# Patient Record
Sex: Female | Born: 1970 | Race: White | Hispanic: No | Smoking: Never smoker
Health system: Southern US, Community
[De-identification: ages and names within clinical notes are randomized; demographics above are authoritative.]

## PROBLEM LIST (undated history)

## (undated) DIAGNOSIS — R569 Unspecified convulsions: Secondary | ICD-10-CM

## (undated) DIAGNOSIS — I219 Acute myocardial infarction, unspecified: Secondary | ICD-10-CM

## (undated) DIAGNOSIS — E785 Hyperlipidemia, unspecified: Secondary | ICD-10-CM

## (undated) DIAGNOSIS — I10 Essential (primary) hypertension: Secondary | ICD-10-CM

## (undated) HISTORY — PX: NO PAST SURGERIES: SHX2092

---

## 2009-08-28 ENCOUNTER — Ambulatory Visit: Payer: Self-pay | Admitting: Internal Medicine

## 2010-10-23 ENCOUNTER — Ambulatory Visit: Payer: Self-pay | Admitting: Internal Medicine

## 2012-11-05 ENCOUNTER — Emergency Department: Payer: Self-pay | Admitting: Emergency Medicine

## 2012-11-05 LAB — CBC
HGB: 13.3 g/dL (ref 12.0–16.0)
MCH: 30.8 pg (ref 26.0–34.0)
MCHC: 35.2 g/dL (ref 32.0–36.0)
MCV: 87 fL (ref 80–100)
Platelet: 312 10*3/uL (ref 150–440)
RBC: 4.32 10*6/uL (ref 3.80–5.20)
WBC: 6.4 10*3/uL (ref 3.6–11.0)

## 2012-11-05 LAB — CK TOTAL AND CKMB (NOT AT ARMC): CK-MB: 1.2 ng/mL (ref 0.5–3.6)

## 2012-11-05 LAB — COMPREHENSIVE METABOLIC PANEL
Albumin: 3.8 g/dL (ref 3.4–5.0)
BUN: 8 mg/dL (ref 7–18)
Bilirubin,Total: 0.3 mg/dL (ref 0.2–1.0)
Co2: 28 mmol/L (ref 21–32)
EGFR (African American): >60
EGFR (Non-African Amer.): >60
Osmolality: 276 (ref 275–301)
Potassium: 3.7 mmol/L (ref 3.5–5.1)
SGPT (ALT): 36 U/L (ref 12–78)
Total Protein: 7.2 g/dL (ref 6.4–8.2)

## 2012-11-05 LAB — PRO B NATRIURETIC PEPTIDE: B-Type Natriuretic Peptide: 212 pg/mL — ABNORMAL HIGH (ref 0–125)

## 2012-11-05 LAB — TROPONIN I: Troponin-I: <0.02 ng/mL

## 2012-12-24 ENCOUNTER — Inpatient Hospital Stay: Payer: Self-pay | Admitting: Internal Medicine

## 2012-12-24 LAB — CBC
HGB: 12.6 g/dL (ref 12.0–16.0)
MCH: 31 pg (ref 26.0–34.0)
MCHC: 35.2 g/dL (ref 32.0–36.0)
Platelet: 301 10*3/uL (ref 150–440)
RDW: 12.9 % (ref 11.5–14.5)

## 2012-12-24 LAB — COMPREHENSIVE METABOLIC PANEL
Anion Gap: 3 — ABNORMAL LOW (ref 7–16)
Chloride: 110 mmol/L — ABNORMAL HIGH (ref 98–107)
Co2: 27 mmol/L (ref 21–32)
Creatinine: 0.68 mg/dL (ref 0.60–1.30)
EGFR (Non-African Amer.): >60
Glucose: 111 mg/dL — ABNORMAL HIGH (ref 65–99)
Osmolality: 278 (ref 275–301)
Potassium: 3.3 mmol/L — ABNORMAL LOW (ref 3.5–5.1)
Sodium: 140 mmol/L (ref 136–145)
Total Protein: 6.9 g/dL (ref 6.4–8.2)

## 2012-12-24 LAB — URINALYSIS, COMPLETE
Bilirubin,UR: NEGATIVE
Blood: NEGATIVE
Glucose,UR: NEGATIVE mg/dL (ref 0–75)
Ketone: NEGATIVE
Nitrite: NEGATIVE
Ph: 7 (ref 4.5–8.0)
Protein: NEGATIVE
Squamous Epithelial: NONE SEEN

## 2012-12-24 LAB — TROPONIN I: Troponin-I: <0.02 ng/mL

## 2012-12-24 LAB — CK TOTAL AND CKMB (NOT AT ARMC)
CK, Total: 129 U/L (ref 21–215)
CK-MB: 1.7 ng/mL (ref 0.5–3.6)

## 2012-12-25 LAB — CK TOTAL AND CKMB (NOT AT ARMC)
CK, Total: 113 U/L (ref 21–215)
CK, Total: 99 U/L (ref 21–215)
CK-MB: 2.1 ng/mL (ref 0.5–3.6)
CK-MB: 1.9 ng/mL (ref 0.5–3.6)

## 2012-12-25 LAB — LIPID PANEL
Cholesterol: 139 mg/dL (ref 0–200)
Ldl Cholesterol, Calc: 74 mg/dL (ref 0–100)
VLDL Cholesterol, Calc: 25 mg/dL (ref 5–40)

## 2012-12-25 LAB — CK-MB
CK-MB: 2.5 ng/mL (ref 0.5–3.6)
CK-MB: 1.7 ng/mL (ref 0.5–3.6)

## 2012-12-25 LAB — TROPONIN I: Troponin-I: 0.31 ng/mL — ABNORMAL HIGH

## 2012-12-25 LAB — MAGNESIUM: Magnesium: 1.8 mg/dL

## 2012-12-25 LAB — POTASSIUM: Potassium: 4.1 mmol/L (ref 3.5–5.1)

## 2012-12-26 LAB — BASIC METABOLIC PANEL
Calcium, Total: 8.5 mg/dL (ref 8.5–10.1)
Creatinine: 0.67 mg/dL (ref 0.60–1.30)
EGFR (African American): >60
EGFR (Non-African Amer.): >60
Glucose: 101 mg/dL — ABNORMAL HIGH (ref 65–99)

## 2012-12-26 LAB — CBC
HCT: 37.7 % (ref 35.0–47.0)
MCH: 30.8 pg (ref 26.0–34.0)
MCHC: 34.6 g/dL (ref 32.0–36.0)
MCV: 89 fL (ref 80–100)
Platelet: 309 10*3/uL (ref 150–440)
RBC: 4.24 10*6/uL (ref 3.80–5.20)
RDW: 12.6 % (ref 11.5–14.5)

## 2013-08-08 ENCOUNTER — Ambulatory Visit: Payer: Self-pay | Admitting: Internal Medicine

## 2014-02-16 DIAGNOSIS — I219 Acute myocardial infarction, unspecified: Secondary | ICD-10-CM

## 2014-02-16 HISTORY — PX: CARDIAC CATHETERIZATION: SHX172

## 2014-02-16 HISTORY — DX: Acute myocardial infarction, unspecified: I21.9

## 2014-06-08 NOTE — Discharge Summary (Signed)
PATIENT NAME:  Samantha Watson, Samantha Watson MR#:  [id number] DATE OF BIRTH:  03-22-1970  DATE OF ADMISSION:  12/24/2012 DATE OF DISCHARGE:  12/26/2012  PRIMARY CARE PHYSICIAN: None local.  CONSULTING PHYSICIAN: Darlin Priestly. Fath, MD  DISCHARGE DIAGNOSIS: 1.  Non-ST segment elevation myocardial infarction.  2.  Hypertension.  3.  Seizure disorder.   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   CONDITION: Stable.   CODE STATUS: Full code.   HOME MEDICATIONS: Atenolol 50 mg p.o. daily, carbamazepine 200 mg p.o. b.i.d., Lipitor 40 mg p.o. at bedtime.   NEW MEDICATIONS:  1.  Imdur 30 mg p.o. daily, nitroglycerin 0.4 mg sublingual every 5 minutes p.r.n. for chest pain.  2.  Aspirin 325 mg p.o. daily.   DIET: Low-sodium, low-fat, low-cholesterol diet.   ACTIVITY: As tolerated.   FOLLOWUP CARE: Follow up with PCP within 1 to 2 weeks. Follow up with Dr. Lady Gary within 1 to 2 weeks.   REASON FOR ADMISSION: Chest pain.   HOSPITAL COURSE: The patient is a 44 year old Caucasian female with a history of hypertension, seizure disorder. The patient also had a recent abnormal stress test due to chest pain. The patient presented to ED with chest pain, which is retrosternal, 7 out of 10 intensity, nonradiating. For detailed history and physical examination, please refer to the admission note dictated by Dr. Clint Guy. On admission date, the patient's laboratory data showed EKG normal sinus rhythm at 74 BPM. BUN 6, sodium 140, potassium 3.3, chloride 110, glucose 111. CBC: 11.5. Urinalysis negative. Troponin was 0.02 on admission date; however, the patient's troponin increased to 0.30 and 0.31. The patient was diagnosed with non-STEMI. After admission, the patient has been treated with aspirin, statin and Lovenox. Dr. Lady Gary discussed with the patient, did a cardiac catheterization today which showed RCA 90% stenosis. Dr. Lady Gary recommended medical treatment and suggested adding Imdur today. The patient  had no more chest pain after admission. The patient's vital signs are stable. She is clinically stable and will be discharged to home today 6 hours after cardiac catheterization if no bleeding.   For hypokalemia, the patient was treated with potassium. Potassium level increased to normal range.   For hypertension, the patient has been treated with atenolol.   The patient is clinically stable and will be discharged to home today. I discussed the patient's discharge plan with the patient, nurse, case manager and patient's father.   TIME SPENT: About 35 minutes.   ____________________________ Shaune Pollack, MD qc:jcm D: 12/26/2012 15:32:50 ET T: 12/26/2012 19:48:52 ET JOB#: [id number]  cc: Shaune Pollack, MD, <Dictator> Shaune Pollack MD ELECTRONICALLY SIGNED 12/27/2012 13:34

## 2014-06-08 NOTE — H&P (Signed)
PATIENT NAME:  Samantha Watson, Samantha Watson MR#:  [id number] DATE OF BIRTH:  12-23-1970  DATE OF ADMISSION:  12/24/2012  REFERRING PHYSICIAN:  Dr. Lenard Lance.   PRIMARY CARE PHYSICIAN:  Nonlocal, out of [facility].   CHIEF COMPLAINT:  Chest pain.   HISTORY OF PRESENT ILLNESS:  This is a 44 year old Caucasian female with past medical history of hypertension, seizure disorder, who also has a recent abnormal stress test who is presenting with chest pain.  She has one day duration of acute chest which occurred at rest while she was watching TV, states it was retrosternal, 7 out of 10 in intensity, nonradiating, described as a pressure with tightness with associated shortness of breath.  She states that she has had similar symptoms over the preceding few weeks though mainly they are with exertion when she is exercising, but had similar symptoms when she underwent her stress test, states her symptoms are also brought on by emotional stress.  She denies any relieving factors.  On presentation to the Emergency Department, she was given aspirin and nitroglycerin.  She is currently chest pain-free, given the history of recent stress test that was abnormal per the patient.  Case was discussed with Dr. Lady Gary who is unable to find the results of stress of test at this time.    REVIEW OF SYSTEMS: CONSTITUTIONAL:  Denies fevers, fatigue, weakness.  EYES:  Denies blurred vision, double vision, eye pain.  EARS, NOSE, THROAT:  Denies ear pain, tinnitus or discharge.  RESPIRATORY:  Denies cough, wheeze, shortness of breath.  CARDIOVASCULAR:  Positive for chest pain as above.  However, denies any orthopnea, edema, arrhythmias, palpitations, lower extremity edema.  GASTROINTESTINAL:  Denies nausea, vomiting, diarrhea, abdominal pain.  GENITOURINARY:  Denies dysuria, hematuria.  ENDOCRINE:  Denies nocturia or polyuria.  HEMATOLOGIC AND LYMPHATIC:  Denies easy bruising or bleeding.  SKIN:  Denies rashes or lesions.   MUSCULOSKELETAL:  Denies any pain in neck, back, knees, shoulders, hips, any arthritic symptoms.  NEUROLOGIC:  Denies any paralysis, paresthesias. PSYCHIATRIC:  Denies any anxiety or depressive symptoms.  Otherwise, full review of systems performed by me is negative.   PAST MEDICAL HISTORY:  Hypertension as well as seizure disorder, last seizure many years ago.   FAMILY HISTORY:  Positive for hypertension as well as coronary artery disease.  Apparently her grandfather had a myocardial infarction she believes in his early 78s though she is unsure.   SOCIAL HISTORY:  Denies any alcohol, tobacco or drug usage.   ALLERGIES:  PENICILLIN.   HOME MEDICATIONS:  Include atenolol 50 mg by mouth daily, carbamazepine 200 mg by mouth twice daily, Lipitor 40 mg by mouth at bedtime.   PHYSICAL EXAMINATION: VITAL SIGNS:  Temperature 98.1, heart rate 73, respirations 18, blood pressure 100/53, saturating 98% on room air.  Weight 57.6 kg.  GENERAL:  Well-nourished, well-developed, Caucasian female who is currently in no acute distress.  HEAD:  Normocephalic, atraumatic.  EYES:  Pupils equal, round, reactive to light as well as accommodation.  Extraocular muscles intact.  No scleral icterus.  MOUTH:  Moist mucous membranes.  Dentition intact.  No abscess noted.  EAR, NOSE, THROAT:  Throat clear without exudates.  No external lesions.  NECK:  Supple.  No thyromegaly.  No JVD.  No nodules.  PULMONARY:  Clear to auscultation bilaterally without wheezes, rubs or rhonchi.  No use of accessory muscles with respiratory effort.  CHEST:  Nontender to palpation.  CARDIOVASCULAR:  S1, S2, regular rate and rhythm.  No  murmurs, rubs or gallops.  No peripheral edema.  Pedal pulses 2+ bilaterally.  GASTROINTESTINAL:  Soft, nontender, nondistended.  No masses.  Positive bowel sounds.  No hepatosplenomegaly.  MUSCULOSKELETAL:  No swelling, clubbing or edema.  Range of motion full in all extremities.  NEUROLOGIC:  Cranial  nerves II through XII intact.  No gross focal neurological deficits.  Sensation intact.  Reflexes intact.  SKIN:  No ulcerations, lesions, rashes or cyanosis.  Skin is warm and dry.  Turgor is intact. PSYCHIATRIC:  Mood and affect within normal limits.  The patient is awake, alert, oriented x 3.  Insight and judgment are intact.   LABORATORY DATA:  EKG:  Normal sinus rhythm, heart rate 74.  No significant ST or T wave abnormalities, though minimal voltage criteria for left ventricular hypertrophy.  Sodium 140, potassium of 3.3, chloride 110, bicarb 27, BUN 6, glucose 111.  LFTs within normal limits.  Cardiac enzymes, troponin I less than 0.02, CK-MB 1.7, CK 129, WBC 11.5, hemoglobin 12.6, platelets 301.  Urinalysis negative for evidence of infection.   ASSESSMENT AND PLAN:  A 44 year old Caucasian female with past medical history of hypertension, seizure disorder as well as her self-reported recent abnormal stress test presenting with chest pain.  1.  Chest pain.  She will be given aspirin and nitroglycerin.  She is now chest pain-free.  We will trend cardiac enzymes.  Admit to telemetry under observation.  We will consult cardiology and the case discussed with Dr. Lady Gary.  Unfortunately he was unable to find the results of her recent stress test.  We will check her lipid panel.  Continue her statin and beta-blockade.  2.  Hypokalemia.  Replace potassium with a goal between 4 and 5.  3.  Hypertension.  Continue atenolol.  4.  Seizure disorder:  Continue carbamazepine.  5.  CODE STATUS:  THE PATIENT IS A FULL CODE.   TIME SPENT:  45 minutes.    ____________________________ Cletis Athens. Hower, MD dkh:ea D: 12/24/2012 22:28:13 ET T: 12/24/2012 23:02:06 ET JOB#: [id number]  cc: Cletis Athens. Hower, MD, <Dictator> DAVID Synetta Shadow MD ELECTRONICALLY SIGNED 12/25/2012 0:25

## 2014-06-08 NOTE — Consult Note (Signed)
Present Illness 44 yo female with history of seizure disorder and hypertension who has had an outpatient work up for chest pain with both typical and atypical features. She underwent a exercize stress echo as an outpatient on 12/02/12 which was read as showing apical ischemia. Cardiac cath was discussed and recommended but deferred per patient. She is now admitted with recurrent mid sternal chest pain. EKG is unremarkable. Mi8ld troponin elevation to 0.3. Pain has resolved.   Physical Exam:  GEN well developed, well nourished, no acute distress   HEENT PERRL, hearing intact to voice   NECK supple   RESP normal resp effort  clear BS   CARD Regular rate and rhythm  No murmur   ABD denies tenderness  no liver/spleen enlargement  no Adominal Mass   LYMPH negative neck   EXTR negative cyanosis/clubbing, negative edema   SKIN normal to palpation   NEURO cranial nerves intact, motor/sensory function intact   PSYCH A+O to time, place, person   Review of Systems:  Subjective/Chief Complaint chest pain with mild troponin elevation and abnormal functional study as outpatient.   Skin: No Complaints   ENT: No Complaints   Eyes: No Complaints   Neck: No Complaints   Respiratory: No Complaints   Cardiovascular: Chest pain or discomfort   Gastrointestinal: No Complaints   Genitourinary: No Complaints   Vascular: No Complaints   Musculoskeletal: No Complaints   Neurologic: No Complaints   Hematologic: No Complaints   Endocrine: No Complaints   Psychiatric: No Complaints   Review of Systems: All other systems were reviewed and found to be negative   Medications/Allergies Reviewed Medications/Allergies reviewed   EKG:  EKG NSR   Abnormal NSSTTW changes   Radiology Results: XRay:    08-Nov-14 18:05, Chest Portable Single View  Chest Portable Single View   REASON FOR EXAM:    Chest pain  COMMENTS:       PROCEDURE: DXR - DXR PORTABLE CHEST SINGLE VIEW  - Dec 24 2012  6:05PM     CLINICAL DATA:  Chest pain    EXAM:  PORTABLE CHEST - 1 VIEW    COMPARISON:  None.    FINDINGS:  Lungs are clear. No pleural effusion or pneumothorax.  The heart is normal in size.     IMPRESSION:  No evidence of acute cardiopulmonary disease.      Electronically Signed    By: Charline Bills M.D.    On: 12/24/2012 18:08         Verified By: Charline Bills, M.D.,    Penicillin: Hives   Impression 44 yo female with history of hypertension and seizure disorder who was admitted with complaints of midsternal chest pain. SHe had an outpatient workuip for this and stress echo suggested apical ischemia. She deferred cardiac cath at that time. She now is admitted with recurrent chest pain and has ruled in for a small nstemi with serum troponin of 0.3. SHe is currently hemodynamically stable with no chest pain. EKG unremarkable. Discussed risk and benefits of cardiac cath with patient and she agrees to proceed.   Plan 1. Continue with beta blockers, statin, asa, lovenox 2. Risk and benefits of cardiac cath explained to patient and she agrees to proceed. 3. Proceed with left cardiac cath via left radial or right groin in am. 4. Further recs after cath.   Electronic Signatures: Dalia Heading (MD)  (Signed [phone] 08:37)  Authored: General Aspect/Present Illness, History and Physical Exam, Review  of System, Home Medications, EKG , Radiology, Allergies, Impression/Plan   Last Updated: 09-Nov-14 08:37 by Dalia Heading (MD)

## 2014-09-05 ENCOUNTER — Ambulatory Visit
Admission: EM | Admit: 2014-09-05 | Discharge: 2014-09-05 | Disposition: A | Discharge status: Home / Self Care | Payer: Medicare Other | Attending: Internal Medicine | Admitting: Internal Medicine

## 2014-09-05 ENCOUNTER — Encounter: Payer: Self-pay | Admitting: Emergency Medicine

## 2014-09-05 DIAGNOSIS — L0292 Furuncle, unspecified: Secondary | ICD-10-CM | POA: Diagnosis not present

## 2014-09-05 MED ORDER — SULFAMETHOXAZOLE-TRIMETHOPRIM 800-160 MG PO TABS: 1.0000 | ORAL_TABLET | Freq: Two times a day (BID) | ORAL | Status: AC

## 2014-09-05 NOTE — ED Provider Notes (Addendum)
CSN: [id number]     Arrival date & time 09/05/14  1627 History   First MD Initiated Contact with Patient 09/05/14 1720     Chief Complaint  Patient presents with  . Cyst    right axilla   HPI  History reviewed. Pertinent past medical history: Coronary disease, hyperlipidemia, seizure disorder, hypertension, anemia, reflux. History reviewed. No pertinent past surgical history. History reviewed. No pertinent family history. History  Substance Use Topics  . Smoking status: Never Smoker   . Smokeless tobacco: Not on file  . Alcohol Use: No    Review of Systems  All other systems reviewed and are negative.   Allergies  Penicillins  Home Medications   carBAMazepine (TEGRETOL) 200 mg tablet Take 1 tablet PO BID   11/24/2012  Active  omeprazole (PRILOSEC) 40 MG DR capsule Take 1 tablet PO every morning on an empty stomach and eat 20 to 30 minutes later   12/08/2012  Active  garlic 1 mg Cap Take by mouth.     Active  multivitamin-Ca-iron-minerals (MULTIPLE VITAMIN, WOMENS) Tab Take by mouth.     Active  atenolol (TENORMIN) 50 MG tablet Take 50 mg by mouth once daily.  11 09/26/2013  Active  simvastatin (ZOCOR) 40 MG tablet Take 40 mg by mouth every evening.  11 09/26/2013  Active  NITROSTAT 0.4 mg SL tablet DISSOLVE ONE TABLET ON THE TONGUE EVERY 5 MINUTES AS NEEDED FOR CHEST PAIN 25 tablet  0 12/11/2013  Active  triamcinolone 0.5 % ointment Apply topically 2 (two) times daily. 15 g  0 03/20/2014 03/20/2015 Active  isosorbide mononitrate (IMDUR) 60 MG ER tablet Take 1 tablet (60 mg total) by mouth once daily. 90 tablet  4 04/26/2014  Active  isosorbide mononitrate (IMDUR) 60 MG ER tablet TAKE 1 TABLET BY MOUTH ONCE A DAY 30 tablet          BP 123/86 mmHg  Pulse 65  Temp(Src) 98.1 F (36.7 C) (Oral)  Resp 16  SpO2 100% Physical Exam  Constitutional: She is oriented to person, place, and time. No distress.  Alert, nicely groomed  HENT:  Head:  Atraumatic.  Eyes:  Conjugate gaze, no eye redness/drainage  Neck: Neck supple.  Cardiovascular: Normal rate.   Pulmonary/Chest: No respiratory distress.  Abdominal: She exhibits no distension.  Musculoskeletal: Normal range of motion.  No leg swelling  Neurological: She is alert and oriented to person, place, and time.  Skin: Skin is warm and dry.  No cyanosis Under the right axilla there are several small red papules and pustules, some with underlying cysts. Patient's attention is on an 8 mm cyst that is slightly tender, not fluctuant, not pointing or draining. Freely mobile. Slightly compressible, not hard.  Nursing note and vitals reviewed.   ED Course  Procedures  No procedures at the urgent care today  MDM   1. Deep folliculitis    prescription for Bactrim sent to the pharmacy. Anticipate slow improvement over the next 10-14 days. Recheck or follow up PCP if not improving over this time course.  Eustace Moore, MD 09/05/14 1744  Eustace Moore, MD 09/05/14 1746  Eustace Moore, MD 09/05/14 1747  Eustace Moore, MD 09/05/14 [phone]

## 2014-09-05 NOTE — ED Notes (Signed)
Pt states that she noticed last night she had a knot under her Right axilla

## 2014-09-05 NOTE — Discharge Instructions (Signed)
Prescription for bactrim/sulfamethoxazole (antibiotic, antiinflammatory) sent to the pharmacy. Anticipate gradual improvement over the next 10-14 days.  Recheck or see you primary care provider if not improving as expected.  Folliculitis  Folliculitis is redness, soreness, and swelling (inflammation) of the hair follicles. This condition can occur anywhere on the body. People with weakened immune systems, diabetes, or obesity have a greater risk of getting folliculitis. CAUSES  Bacterial infection. This is the most common cause.  Fungal infection.  Viral infection.  Contact with certain chemicals, especially oils and tars. Long-term folliculitis can result from bacteria that live in the nostrils. The bacteria may trigger multiple outbreaks of folliculitis over time. SYMPTOMS Folliculitis most commonly occurs on the scalp, thighs, legs, back, buttocks, and areas where hair is shaved frequently. An early sign of folliculitis is a small, white or yellow, pus-filled, itchy lesion (pustule). These lesions appear on a red, inflamed follicle. They are usually less than 0.2 inches (5 mm) wide. When there is an infection of the follicle that goes deeper, it becomes a boil or furuncle. A group of closely packed boils creates a larger lesion (carbuncle). Carbuncles tend to occur in hairy, sweaty areas of the body. DIAGNOSIS  Your caregiver can usually tell what is wrong by doing a physical exam. A sample may be taken from one of the lesions and tested in a lab. This can help determine what is causing your folliculitis. TREATMENT  Treatment may include:  Applying warm compresses to the affected areas.  Taking antibiotic medicines orally or applying them to the skin.  Draining the lesions if they contain a large amount of pus or fluid.  Laser hair removal for cases of long-lasting folliculitis. This helps to prevent regrowth of the hair. HOME CARE INSTRUCTIONS  Apply warm compresses to the affected  areas as directed by your caregiver.  If antibiotics are prescribed, take them as directed. Finish them even if you start to feel better.  You may take over-the-counter medicines to relieve itching.  Do not shave irritated skin.  Follow up with your caregiver as directed. SEEK IMMEDIATE MEDICAL CARE IF:   You have increasing redness, swelling, or pain in the affected area.  You have a fever. MAKE SURE YOU:  Understand these instructions.  Will watch your condition.  Will get help right away if you are not doing well or get worse. Document Released: 04/13/2001 Document Revised: 08/04/2011 Document Reviewed: 05/05/2011 [facility] Patient Information 2015 [place], [place]. This information is not intended to replace advice given to you by your health care provider. Make sure you discuss any questions you have with your health care provider.

## 2014-10-11 IMAGING — CR DG CHEST 2V
1 series · 2 of 2 positions shown · non-contrast
Comparison: none

REASON FOR EXAM: Chest Pain
COMMENTS:

PROCEDURE:     DXR - DXR CHEST PA (OR AP) AND LATERAL  - November 05, 2012  [DATE]
RESULT:     Comparison: None

[Series 1: w chest pa · 0.14mm/px · 2 of 2 slices shown]
[im 1/2]
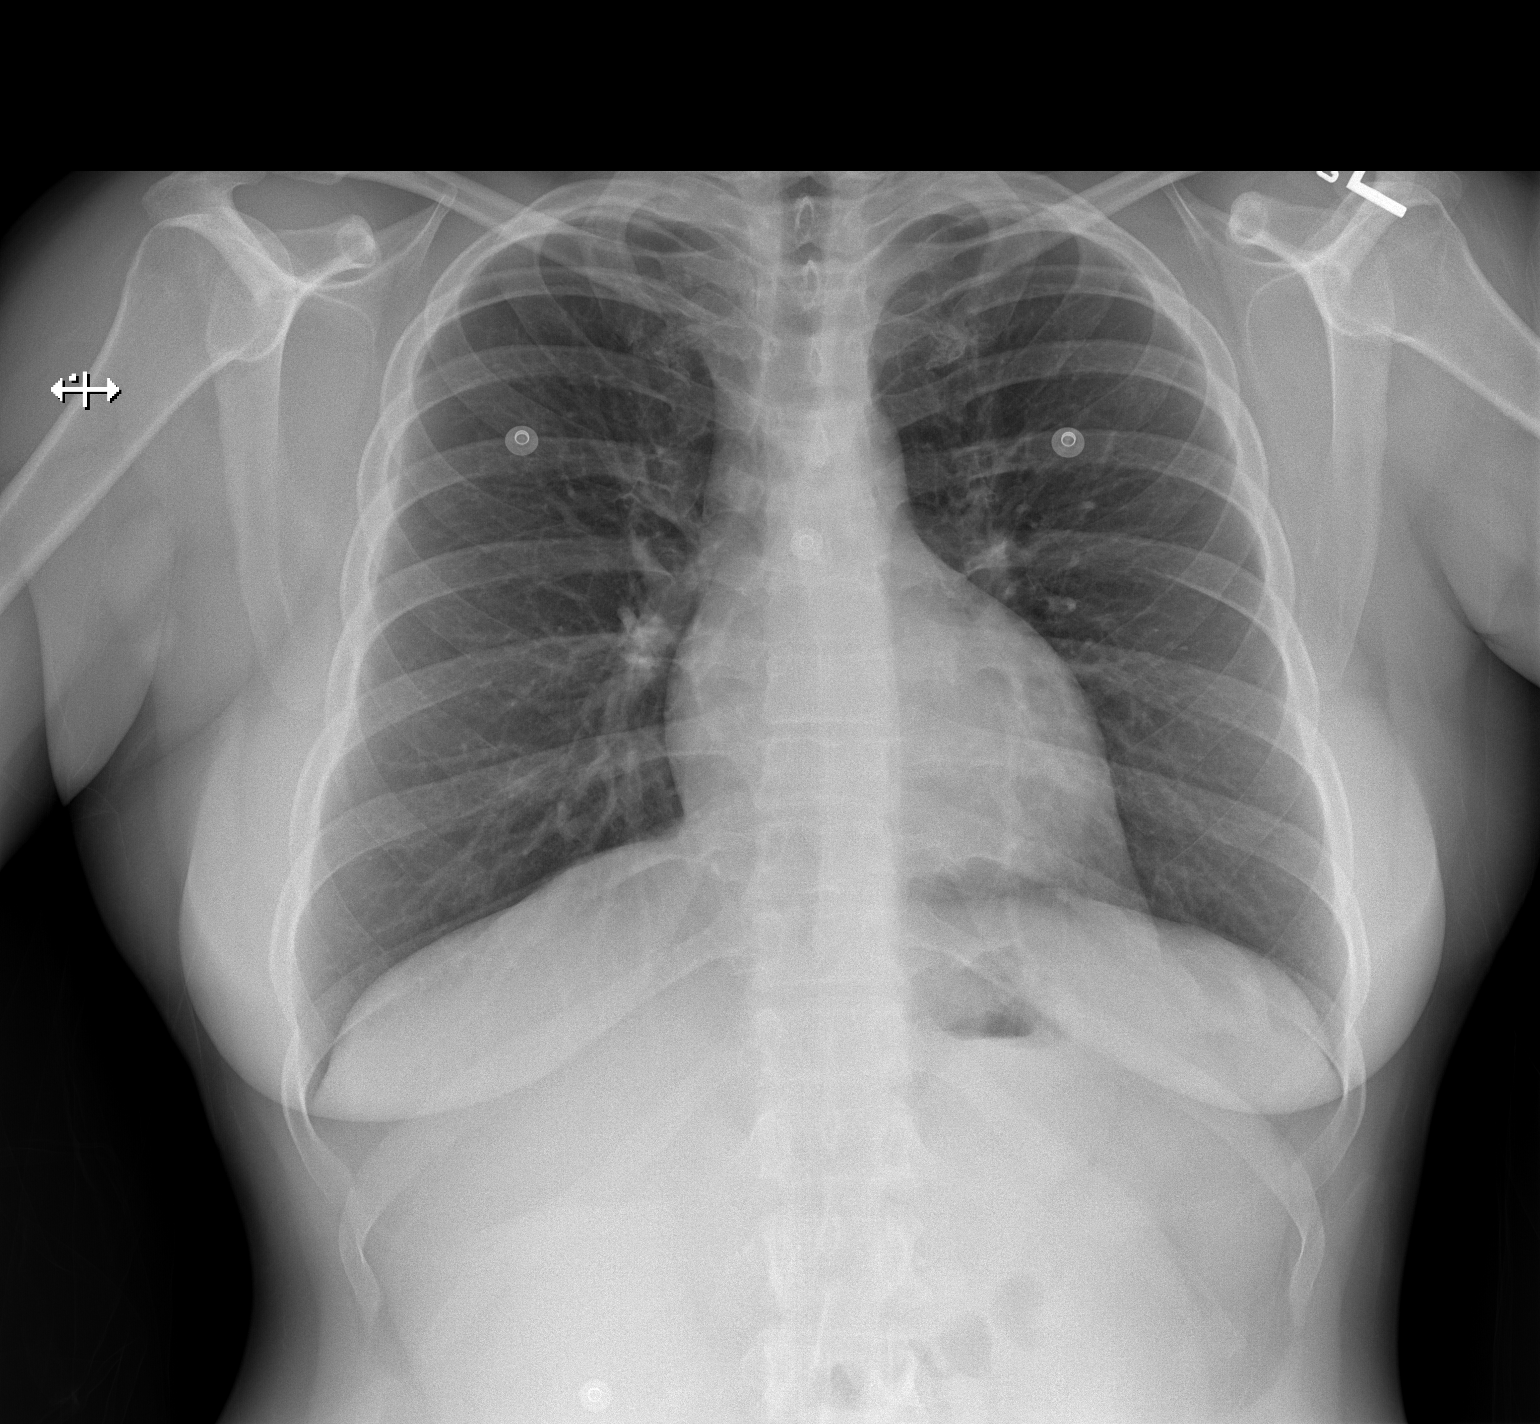
[im 2/2]
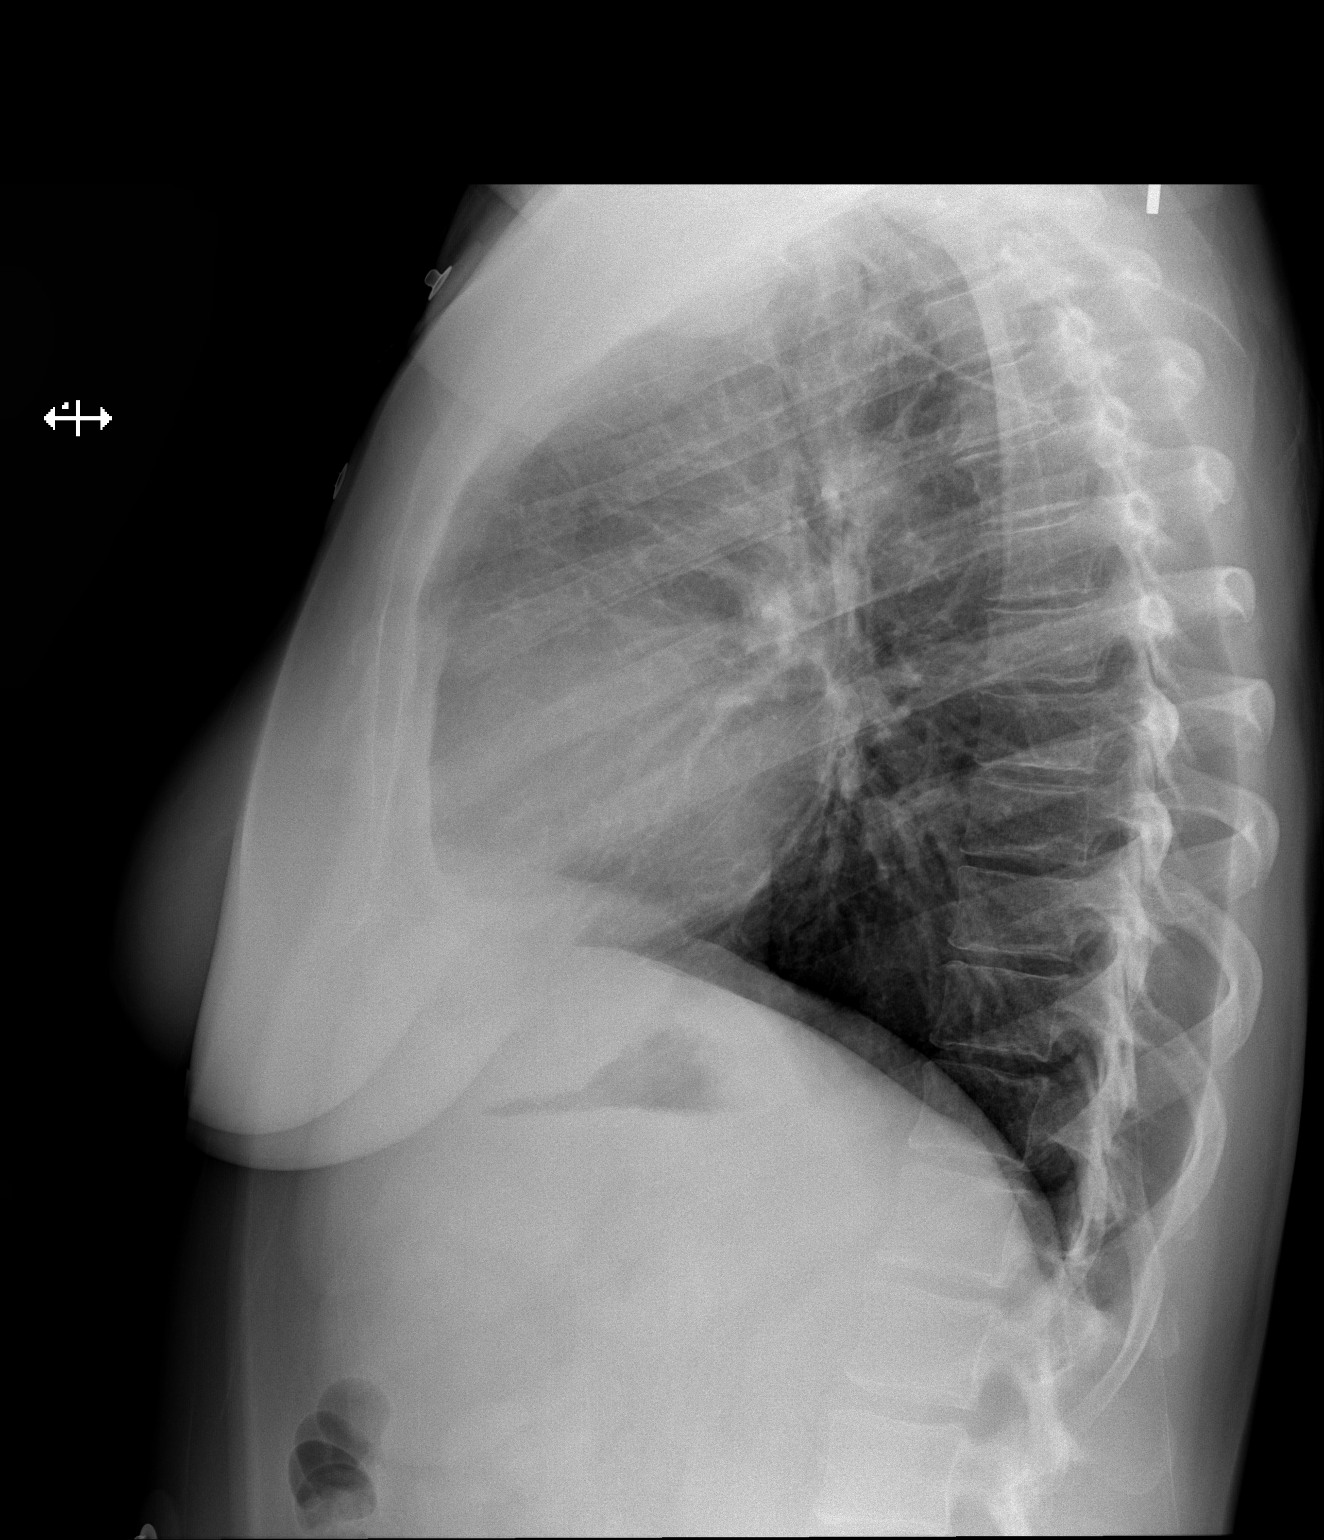

[2 of 2 positions shown; findings below may reference images not displayed]

FINDINGS: PA and lateral chest radiographs are provided.  There is no focal
parenchymal opacity, pleural effusion, or pneumothorax. The heart and
mediastinum are unremarkable.  The osseous structures are unremarkable.
IMPRESSION: No acute disease of the che[REDACTED]

## 2015-02-12 ENCOUNTER — Encounter: Payer: Self-pay | Admitting: Emergency Medicine

## 2015-02-12 ENCOUNTER — Ambulatory Visit (INDEPENDENT_AMBULATORY_CARE_PROVIDER_SITE_OTHER): Payer: Medicare Other

## 2015-02-12 ENCOUNTER — Ambulatory Visit
Admission: EM | Admit: 2015-02-12 | Discharge: 2015-02-12 | Disposition: A | Discharge status: Home / Self Care | Payer: Medicare Other | Attending: Family Medicine | Admitting: Family Medicine

## 2015-02-12 DIAGNOSIS — M79645 Pain in left finger(s): Secondary | ICD-10-CM | POA: Diagnosis not present

## 2015-02-12 HISTORY — DX: Unspecified convulsions: R56.9

## 2015-02-12 NOTE — ED Notes (Signed)
Left wrist pain cannot bend thumb. Pain started 2 days ago.

## 2015-02-12 NOTE — Discharge Instructions (Signed)
° ° °  Wear protection when you are awake so you don't bump the sore area   Thumb /Finger Sprain/Bruise A finger sprain happens when the bands of tissue that hold the finger bones together (ligaments) stretch too much and tear. HOME CARE  Keep your injured finger raised (elevated) when possible.  Put ice on the injured area, twice a day, for 2 to 3 days.  Put ice in a plastic bag.  Place a towel between your skin and the bag.  Leave the ice on for 15 minutes.  Only take medicine as told by your doctor.  Do not wear rings on the injured finger.  Protect your finger until pain and stiffness go away (usually 3 to 4 weeks).  Do not get your cast or splint to get wet. Cover your cast or splint with a plastic bag when you shower or bathe. Do not swim.  Your doctor may suggest special exercises for you to do. These exercises will help keep or stop stiffness from happening. GET HELP RIGHT AWAY IF:  Your cast or splint gets damaged.  Your pain gets worse, not better. MAKE SURE YOU:  Understand these instructions.  Will watch your condition.  Will get help right away if you are not doing well or get worse.   This information is not intended to replace advice given to you by your health care provider. Make sure you discuss any questions you have with your health care provider.   Document Released: 03/07/2010 Document Revised: 04/27/2011 Document Reviewed: 10/06/2010 Elsevier Interactive Patient Education [employer]

## 2015-02-14 ENCOUNTER — Encounter: Payer: Self-pay | Admitting: Physician Assistant

## 2015-02-14 NOTE — ED Provider Notes (Signed)
CSN: [id number]     Arrival date & time 02/12/15  1740 History   First MD Initiated Contact with Patient 02/12/15 1909     Chief Complaint  Patient presents with  . Wrist Pain   (Consider location/radiation/quality/duration/timing/severity/associated sxs/prior Treatment) HPI 44 yo F presents with boyfriend concerned about discomfort in left thumb. Denied falling or trauma-  then later remembered that 2 days ago she smacked the mailbox with left hand -- and believes she has had discomfort since then Hand and wrist are fine on her report to me, only discomfort in in the base of the thumb.  History is reported that she had a "heart attack" in the past but can't remember when.  Had seizures in 12/2012 and has HTN. Denies any outside medications.  Chart review reveal nstemi 12/2012 with 90% RCA on cath Past Medical History  Diagnosis Date  . Seizures [facility])    Past Surgical History  Procedure Laterality Date  . No past surgeries     History reviewed. No pertinent family history. Social History  Substance Use Topics  . Smoking status: Never Smoker   . Smokeless tobacco: None  . Alcohol Use: No   OB History    No data available     Review of Systems Constitutional: No fever.  Eyes: No visual changes. ENT:No sore throat. Cardiovascular:Negative for chest pain/palpitations Respiratory: Negative for shortness of breath Gastrointestinal: No abdominal pain. No nausea,vomiting, diarrhea Genitourinary: Negative for dysuria. Normal urination. Musculoskeletal: Negative for back pain. FROM extremities without pain except left thumb Skin: Negative for rash Neurological: Negative for headache, focal weakness or numbness Allergies  Penicillins  Home Medications   Prior to Admission medications   Not on File   Denied home meds Meds Ordered and Administered this Visit  Medications - No data to display  BP 141/88 mmHg  Pulse 72  Temp(Src) 98.4 F (36.9 C)  Ht 5' (1.524 m)  Wt  145 lb (65.772 kg)  BMI 28.32 kg/m2  SpO2 100%  LMP 02/05/2015 No data found.   Physical Exam    VS noted, WNL  GENERAL : NAD except left thumb discomfort with ROM HEENT: no pharyngeal erythema,no exudate, no erythema of TMs, no cervical LAD RESP: CTA  B , no wheezing, no accessory muscle use CARD: RRR ABD: Not distended MSK: Left thumb, MIP, c/o pain with squeeze. No erythema, no ecchymosis, no swelling. Snuffbox negative.Good opposition-full grip single sheet paper: thumb :2,:3,:4,:5 No numbness or tingling, goodcap fill, FROM NEURO: Good attention, no gross neuro deficit- her recall accuracy appears somewhat limited.  PSYCH: speech and behavior appropriate  ED Course  Procedures (including critical care time)  Labs Review Labs Reviewed - No data to display  Imaging Review No results found. I personally reviewed the xrays and shared them with the patient and her boyfriend. She again denies any knowledge of other trauma to her hand.  Discussed possible scaphoid fracture - needs additional evaluation after time lapse-unknown if current trauma or aged.  Have had velcro thumb spica placed and reviewed finger checks with them. She is given a disc of the films and asked to schedule a one week appointment with her PCP for re-evalaution  Splinted body part was neurovascularly unchanged following the procedure.   MDM   1. Thumb pain, left    Possible non-acute non-displaced fracture of scaphoid,left  Diagnosis and treatment discussed. To schedule re-evaluation with PCP in one week. Questions fielded, expectations and recommendations reviewed.Discussed follow up and return parameters  including no resolution or any worsening condition.. Patient expresses understanding  and agrees to plan. Will return to [facility] with questions, concerns or exacerbation.   Rae Halsted, PA-C 02/14/15 [phone]

## 2015-09-01 ENCOUNTER — Encounter: Payer: Self-pay | Admitting: Emergency Medicine

## 2015-09-01 ENCOUNTER — Ambulatory Visit
Admission: EM | Admit: 2015-09-01 | Discharge: 2015-09-01 | Disposition: A | Discharge status: Home / Self Care | Payer: Managed Care, Other (non HMO) | Attending: Family Medicine | Admitting: Family Medicine

## 2015-09-01 DIAGNOSIS — L0291 Cutaneous abscess, unspecified: Secondary | ICD-10-CM

## 2015-09-01 HISTORY — DX: Acute myocardial infarction, unspecified: I21.9

## 2015-09-01 HISTORY — DX: Essential (primary) hypertension: I10

## 2015-09-01 MED ORDER — LIDOCAINE HCL (PF) 1 % IJ SOLN
5.0000 mL | Freq: Once | INTRAMUSCULAR | Status: AC
  Administered 2015-09-01: 5 mL

## 2015-09-01 MED ORDER — DOXYCYCLINE HYCLATE 100 MG PO CAPS: 100.0000 mg | ORAL_CAPSULE | Freq: Two times a day (BID) | ORAL | Status: DC

## 2015-09-01 NOTE — Discharge Instructions (Signed)

## 2015-09-01 NOTE — ED Notes (Signed)
Pt reports she used a sterilized a pin with alcohol and popped it yesterday.

## 2015-09-01 NOTE — ED Provider Notes (Signed)
CSN: [id number]     Arrival date & time 09/01/15  1034 History   First MD Initiated Contact with Patient 09/01/15 1053     Chief Complaint  Patient presents with  . Abscess   (Consider location/radiation/quality/duration/timing/severity/associated sxs/prior Treatment) HPI  45 year old female presents with the above complaint.  Patient states that yesterday she noticed a bump in the crease of her right elbow. She states that she sterilized a needle and "popped it". She reports bloody drainage but no pus. Since that time it is been worsening. It is now firm/hard to touch. She reports associated pain. No associated fevers or chills. No medications taken for this. She has used over-the-counter peroxide and alcohol without resolution. No relieving factors. No other complaints at this time.  Past Medical History  Diagnosis Date  . Seizures (HCC)   . Hypertension   . MI (myocardial infarction) (HCC) 2016   Past Surgical History  Procedure Laterality Date  . No past surgeries    . Cardiac catheterization  2016    possible    History reviewed. No pertinent family history. Social History  Substance Use Topics  . Smoking status: Never Smoker   . Smokeless tobacco: None  . Alcohol Use: No   OB History    No data available     Review of Systems  Constitutional: Negative for fever.  Skin:       Abscess.    Allergies  Penicillins  Home Medications   Prior to Admission medications   Medication Sig Start Date End Date Taking? Authorizing Provider  ATENOLOL PO Take by mouth.   Yes Historical Provider, MD  CARBAMAZEPINE PO Take by mouth.   Yes Historical Provider, MD  IRON CR PO Take by mouth.   Yes Historical Provider, MD  doxycycline (VIBRAMYCIN) 100 MG capsule Take 1 capsule (100 mg total) by mouth 2 (two) times daily. 09/01/15   Tommie Sams, DO   Meds Ordered and Administered this Visit   Medications  lidocaine (PF) (XYLOCAINE) 1 % injection 5 mL (5 mLs Infiltration Given  by Other 09/01/15 1124)    BP 145/93 mmHg  Pulse 74  Temp(Src) 98.6 F (37 C) (Oral)  Resp 16  Ht 5' (1.524 m)  Wt 140 lb (63.504 kg)  BMI 27.34 kg/m2  SpO2 100%  LMP 08/02/2014 (Approximate) No data found.  Physical Exam  Constitutional: She is oriented to person, place, and time. She appears well-developed. No distress.  Pulmonary/Chest: Effort normal.  Neurological: She is alert and oriented to person, place, and time.  Skin:  R elbow (antecubital fossa) - approximately 2.5-3 cm area of erythema and induration. Firm to touch.  Psychiatric: She has a normal mood and affect.  Vitals reviewed.  ED Course  Procedures (including critical care time) Incision & Drainage  Informed consent obtained.  Local anesthesia performed. 4 mL of 1% lidocaine w/o epi.  Abscess opened with a #11 scalpel via stab incision. Blood and an scant amount of purulence noted.  Wound was not packed as it was very small and there was minimal purulence.  Wound dressed with gauze.  MDM   1. Abscess    New acute problem. I&D performed today. Treating with Doxycyline. Return precautions given.    Tommie Sams, DO 09/01/15 1132

## 2015-09-01 NOTE — ED Notes (Signed)
Pt has abscess, infection R arm AC, noticed it yesterday or the day before. Maybe an insect bite but did not see anything bite her arm. Pt has been putting on peroxide and a bandage. Pt feels a knot in her arm and redness.

## 2015-09-18 ENCOUNTER — Encounter: Payer: Self-pay | Admitting: Emergency Medicine

## 2015-09-18 ENCOUNTER — Ambulatory Visit
Admission: EM | Admit: 2015-09-18 | Discharge: 2015-09-18 | Disposition: A | Discharge status: Home / Self Care | Payer: Managed Care, Other (non HMO) | Attending: Emergency Medicine | Admitting: Emergency Medicine

## 2015-09-18 DIAGNOSIS — G5603 Carpal tunnel syndrome, bilateral upper limbs: Secondary | ICD-10-CM

## 2015-09-18 MED ORDER — TRAMADOL HCL 50 MG PO TABS: ORAL_TABLET | ORAL | 0 refills | Status: DC

## 2015-09-18 MED ORDER — NAPROXEN 500 MG PO TABS: 500.0000 mg | ORAL_TABLET | Freq: Two times a day (BID) | ORAL | 0 refills | Status: DC

## 2015-09-18 NOTE — ED Provider Notes (Signed)
HPI  SUBJECTIVE:  Samantha Watson is a 45 y.o. female who presents with bilateral arm and hand tingling and numbness for the past week. She describes it as intermittent, lasting minutes to hours. Symptoms are worse with use of her hands, gripping, or prolonged activity. States that her hands "felt swollen this morning". No color changes, fevers, neck pain, arm pain. No grip weakness, states that she is not dropping anything. No previous history of injury to the neck. She states that the pain is waking her up at night occasionally. No change in physical activity, exercise. She states that she is not working. She has never had symptoms like this before. The pain is not associated with shoulder range of motion, neck movement. She denies shoulder or elbow pain. No chest pain, shortness of breath. No change in her medications recently. She does not recall any repetitive activity or resting her wrists on anything. Past medical history of MI, seizures, hypertension, coronary artery disease. No history of cancer, multiple myeloma, carpal disease, peripheral vascular disease, diabetes. LMP: Can't remember, but denies possibility of being pregnant, declined pregnancy testing. PMD: [facility] primary care.    Past Medical History:  Diagnosis Date  . Hypertension   . MI (myocardial infarction) ([place]) 2016  . Seizures ([place])     Past Surgical History:  Procedure Laterality Date  . CARDIAC CATHETERIZATION  2016   possible   . NO PAST SURGERIES      History reviewed. No pertinent family history.  Social History  Substance Use Topics  . Smoking status: Never Smoker  . Smokeless tobacco: Never Used  . Alcohol use No    No current facility-administered medications for this encounter.   Current Outpatient Prescriptions:  .  aspirin 325 MG tablet, Take 325 mg by mouth daily., Disp: , Rfl:  .  ATENOLOL PO, Take by mouth., Disp: , Rfl:  .  CARBAMAZEPINE PO, Take by mouth., Disp: , Rfl:  .  IRON CR PO, Take by  mouth., Disp: , Rfl:  .  naproxen (NAPROSYN) 500 MG tablet, Take 1 tablet (500 mg total) by mouth 2 (two) times daily., Disp: 20 tablet, Rfl: 0 .  traMADol (ULTRAM) 50 MG tablet, 1-2 tabs po q 6 hr prn pain Maximum dose= 8 tablets per day, Disp: 20 tablet, Rfl: 0  Allergies  Allergen Reactions  . Penicillins      ROS  As noted in HPI.   Physical Exam  BP (!) 144/94 (BP Location: Right Arm)   Pulse 70   Temp 97.7 F (36.5 C) (Tympanic)   Resp 16   Ht 5' (1.524 m)   Wt 150 lb (68 kg)   LMP 08/02/2014 (Approximate)   SpO2 100%   BMI 29.29 kg/m   Constitutional: Well developed, well nourished, no acute distress Eyes:  EOMI, conjunctiva normal bilaterally HENT: Normocephalic, atraumatic,mucus membranes moist Neck: No C-spine tenderness, trapezial tenderness. Pain is not aggravated with range of motion of the neck. Respiratory: Normal inspiratory effort Cardiovascular: Normal rate GI: nondistended skin: No rash, skin intact Musculoskeletal: Range of motion and strength in the shoulders, elbows, forearms equal bilaterally. Grip strength equal bilaterally. Cap refill less than 2 seconds bilaterally. Bilateral  distal radius NT, distal ulnar styloid NT, snuffbox NT, carpals NT , metacarpals NT , digits NT.  no pain with radial / ulnar deviation or ROM wrists. Motor intact ability to flex / extend digits of both hands, Sensation LT to hand normal,CR<2 seconds distally. Tinel pos. Phalen pos  [place]  neg.  Shoulder and upper arm NT, Elbow and proximal forearm NT on affected extremity. Neurologic: Alert & oriented x 3, no focal neuro deficits Psychiatric: Speech and behavior appropriate   ED Course   Medications - No data to display  Orders Placed This Encounter  Procedures  . Ambulatory referral to Hand Surgery    Referral Priority:   Routine    Referral Type:   Surgical    Referral Reason:   Specialty Services Required    Referred to Provider:   Roseanne Kaufman, MD     Requested Specialty:   Hand Surgery    Number of Visits Requested:   1  . Splint wrist    Pt to wear at night for carpal tunnel    Standing Status:   Standing    Number of Occurrences:   1    Order Specific Question:   Laterality    Answer:   Bilateral    No results found for this or any previous visit (from the past 24 hour(s)). No results found.  ED Clinical Impression  Bilateral carpal tunnel syndrome   ED Assessment/Plan  Presentation most consistent with bilateral carpal tunnel syndrome, no evidence of peripheral vascular disease, fracture. Imaging was deferred today. Plan to send home with wrist splints to wear at night, Naprosyn as she has not tried anything for this yet, tramadol, follow-up with PMD for hand surgery referral, also ordered follow-up with Dr. Amedeo Plenty, hand surgeon in [place] if she is not getting any better.  Discussed  MDM, plan and followup with patient, Discussed sn/sx that should prompt return to the ED. Patient agrees with plan.  *This clinic note was created using Dragon dictation software. Therefore, there may be occasional mistakes despite careful proofreading.  ?    Melynda Ripple, MD 09/18/15 1113

## 2015-09-18 NOTE — Discharge Instructions (Signed)
Follow-up with your primary care physician to get a referral to a local hand surgeon, or you may follow up with Dr. Amanda Pea in [place]. Wear the splints at night. Go to the ER for the symptoms above.

## 2015-09-18 NOTE — ED Triage Notes (Signed)
Patient c/o numbness and tingling in both arms and hands that started 2-3 weeks ago.  Patient reports if she is using her hands and arms for long periods that symptoms will start.

## 2017-04-07 ENCOUNTER — Encounter
Admission: RE | Disposition: A | Discharge status: Home / Self Care | Payer: Self-pay | Source: Ambulatory Visit | Attending: Cardiology

## 2017-04-07 ENCOUNTER — Ambulatory Visit
Admission: RE | Admit: 2017-04-07 | Discharge: 2017-04-07 | Disposition: A | Discharge status: Home / Self Care | Payer: Medicare HMO | Source: Ambulatory Visit | Attending: Cardiology | Admitting: Cardiology

## 2017-04-07 ENCOUNTER — Encounter: Payer: Self-pay | Admitting: Emergency Medicine

## 2017-04-07 DIAGNOSIS — Z7951 Long term (current) use of inhaled steroids: Secondary | ICD-10-CM | POA: Insufficient documentation

## 2017-04-07 DIAGNOSIS — I25119 Atherosclerotic heart disease of native coronary artery with unspecified angina pectoris: Secondary | ICD-10-CM | POA: Diagnosis not present

## 2017-04-07 DIAGNOSIS — R079 Chest pain, unspecified: Secondary | ICD-10-CM

## 2017-04-07 DIAGNOSIS — I10 Essential (primary) hypertension: Secondary | ICD-10-CM | POA: Diagnosis not present

## 2017-04-07 DIAGNOSIS — Z7982 Long term (current) use of aspirin: Secondary | ICD-10-CM | POA: Insufficient documentation

## 2017-04-07 DIAGNOSIS — Z8249 Family history of ischemic heart disease and other diseases of the circulatory system: Secondary | ICD-10-CM | POA: Diagnosis not present

## 2017-04-07 DIAGNOSIS — Z79899 Other long term (current) drug therapy: Secondary | ICD-10-CM | POA: Diagnosis not present

## 2017-04-07 DIAGNOSIS — E785 Hyperlipidemia, unspecified: Secondary | ICD-10-CM | POA: Insufficient documentation

## 2017-04-07 DIAGNOSIS — R0602 Shortness of breath: Secondary | ICD-10-CM | POA: Diagnosis present

## 2017-04-07 DIAGNOSIS — G40909 Epilepsy, unspecified, not intractable, without status epilepticus: Secondary | ICD-10-CM | POA: Insufficient documentation

## 2017-04-07 HISTORY — PX: LEFT HEART CATH AND CORONARY ANGIOGRAPHY: CATH118249

## 2017-04-07 SURGERY — LEFT HEART CATH AND CORONARY ANGIOGRAPHY
Anesthesia: Moderate Sedation | Laterality: Left

## 2017-04-07 MED ORDER — MIDAZOLAM HCL 2 MG/2ML IJ SOLN
INTRAMUSCULAR | Status: AC
  Filled 2017-04-07: qty 2

## 2017-04-07 MED ORDER — SODIUM CHLORIDE 0.9% FLUSH: 3.0000 mL | Freq: Two times a day (BID) | INTRAVENOUS | Status: DC

## 2017-04-07 MED ORDER — SODIUM CHLORIDE 0.9 % WEIGHT BASED INFUSION: 1.0000 mL/kg/h | INTRAVENOUS | Status: DC

## 2017-04-07 MED ORDER — ONDANSETRON HCL 4 MG/2ML IJ SOLN: 4.0000 mg | Freq: Four times a day (QID) | INTRAMUSCULAR | Status: DC | PRN

## 2017-04-07 MED ORDER — MIDAZOLAM HCL 2 MG/2ML IJ SOLN
INTRAMUSCULAR | Status: DC | PRN
  Administered 2017-04-07: 1 mg via INTRAVENOUS
  Administered 2017-04-07: 0.5 mg via INTRAVENOUS

## 2017-04-07 MED ORDER — ACETAMINOPHEN 325 MG PO TABS: 650.0000 mg | ORAL_TABLET | ORAL | Status: DC | PRN

## 2017-04-07 MED ORDER — SODIUM CHLORIDE 0.9 % IV SOLN: 250.0000 mL | INTRAVENOUS | Status: DC | PRN

## 2017-04-07 MED ORDER — SODIUM CHLORIDE 0.9 % WEIGHT BASED INFUSION
3.0000 mL/kg/h | INTRAVENOUS | Status: AC
  Administered 2017-04-07: 3 mL/kg/h via INTRAVENOUS

## 2017-04-07 MED ORDER — FENTANYL CITRATE (PF) 100 MCG/2ML IJ SOLN
INTRAMUSCULAR | Status: DC | PRN
  Administered 2017-04-07 (×2): 25 ug via INTRAVENOUS

## 2017-04-07 MED ORDER — SODIUM CHLORIDE 0.9% FLUSH: 3.0000 mL | INTRAVENOUS | Status: DC | PRN

## 2017-04-07 MED ORDER — ASPIRIN 81 MG PO CHEW: 81.0000 mg | CHEWABLE_TABLET | ORAL | Status: DC

## 2017-04-07 MED ORDER — HEPARIN (PORCINE) IN NACL 2-0.9 UNIT/ML-% IJ SOLN
INTRAMUSCULAR | Status: AC
  Filled 2017-04-07: qty 1000

## 2017-04-07 MED ORDER — IOPAMIDOL (ISOVUE-300) INJECTION 61%
INTRAVENOUS | Status: DC | PRN
  Administered 2017-04-07: 95 mL via INTRA_ARTERIAL

## 2017-04-07 MED ORDER — FENTANYL CITRATE (PF) 100 MCG/2ML IJ SOLN
INTRAMUSCULAR | Status: AC
  Filled 2017-04-07: qty 2

## 2017-04-07 SURGICAL SUPPLY — 9 items
CATH 5FR PIGTAIL DIAGNOSTIC (CATHETERS) ×2 IMPLANT
CATH INFINITI 5FR JL4 (CATHETERS) ×2 IMPLANT
CATH INFINITI JR4 5F (CATHETERS) ×2 IMPLANT
DEVICE CLOSURE MYNXGRIP 5F (Vascular Products) ×2 IMPLANT
KIT MANI 3VAL PERCEP (MISCELLANEOUS) ×2 IMPLANT
NEEDLE PERC 18GX7CM (NEEDLE) ×2 IMPLANT
PACK CARDIAC CATH (CUSTOM PROCEDURE TRAY) ×2 IMPLANT
SHEATH AVANTI 5FR X 11CM (SHEATH) ×2 IMPLANT
WIRE GUIDERIGHT .035X150 (WIRE) ×2 IMPLANT

## 2017-04-07 NOTE — Discharge Instructions (Signed)
Femoral Site Care °Refer to this sheet in the next few weeks. These instructions provide you with information about caring for yourself after your procedure. Your health care provider may also give you more specific instructions. Your treatment has been planned according to current medical practices, but problems sometimes occur. Call your health care provider if you have any problems or questions after your procedure. °What can I expect after the procedure? °After your procedure, it is typical to have the following: °· Bruising at the site that usually fades within 1-2 weeks. °· Blood collecting in the tissue (hematoma) that may be painful to the touch. It should usually decrease in size and tenderness within 1-2 weeks. ° °Follow these instructions at home: °· Take medicines only as directed by your health care provider. °· You may shower 24-48 hours after the procedure or as directed by your health care provider. Remove the bandage (dressing) and gently wash the site with plain soap and water. Pat the area dry with a clean towel. Do not rub the site, because this may cause bleeding. °· Do not take baths, swim, or use a hot tub until your health care provider approves. °· Check your insertion site every day for redness, swelling, or drainage. °· Do not apply powder or lotion to the site. °· Limit use of stairs to twice a day for the first 2-3 days or as directed by your health care provider. °· Do not squat for the first 2-3 days or as directed by your health care provider. °· Do not lift over 10 lb (4.5 kg) for 5 days after your procedure or as directed by your health care provider. °· Ask your health care provider when it is okay to: °? Return to work or school. °? Resume usual physical activities or sports. °? Resume sexual activity. °· Do not drive home if you are discharged the same day as the procedure. Have someone else drive you. °· You may drive 24 hours after the procedure unless otherwise instructed by  your health care provider. °· Do not operate machinery or power tools for 24 hours after the procedure or as directed by your health care provider. °· If your procedure was done as an outpatient procedure, which means that you went home the same day as your procedure, a responsible adult should be with you for the first 24 hours after you arrive home. °· Keep all follow-up visits as directed by your health care provider. This is important. °Contact a health care provider if: °· You have a fever. °· You have chills. °· You have increased bleeding from the site. Hold pressure on the site. °Get help right away if: °· You have unusual pain at the site. °· You have redness, warmth, or swelling at the site. °· You have drainage (other than a small amount of blood on the dressing) from the site. °· The site is bleeding, and the bleeding does not stop after 30 minutes of holding steady pressure on the site. °· Your leg or foot becomes pale, cool, tingly, or numb. °This information is not intended to replace advice given to you by your health care provider. Make sure you discuss any questions you have with your health care provider. °Document Released: 10/06/2013 Document Revised: 07/11/2015 Document Reviewed: 08/22/2013 °Elsevier Interactive Patient Education © 2018 [employer] °Moderate Conscious Sedation, Adult, Care After °These instructions provide you with information about caring for yourself after your procedure. Your health care provider may also give you more   specific instructions. Your treatment has been planned according to current medical practices, but problems sometimes occur. Call your health care provider if you have any problems or questions after your procedure. °What can I expect after the procedure? °After your procedure, it is common: °· To feel sleepy for several hours. °· To feel clumsy and have poor balance for several hours. °· To have poor judgment for several hours. °· To vomit if you eat too  soon. ° °Follow these instructions at home: °For at least 24 hours after the procedure: ° °· Do not: °? Participate in activities where you could fall or become injured. °? Drive. °? Use heavy machinery. °? Drink alcohol. °? Take sleeping pills or medicines that cause drowsiness. °? Make important decisions or sign legal documents. °? Take care of children on your own. °· Rest. °Eating and drinking °· Follow the diet recommended by your health care provider. °· If you vomit: °? Drink water, juice, or soup when you can drink without vomiting. °? Make sure you have little or no nausea before eating solid foods. °General instructions °· Have a responsible adult stay with you until you are awake and alert. °· Take over-the-counter and prescription medicines only as told by your health care provider. °· If you smoke, do not smoke without supervision. °· Keep all follow-up visits as told by your health care provider. This is important. °Contact a health care provider if: °· You keep feeling nauseous or you keep vomiting. °· You feel light-headed. °· You develop a rash. °· You have a fever. °Get help right away if: °· You have trouble breathing. °This information is not intended to replace advice given to you by your health care provider. Make sure you discuss any questions you have with your health care provider. °Document Released: 11/23/2012 Document Revised: 07/08/2015 Document Reviewed: 05/25/2015 °Elsevier Interactive Patient Education © 2018 Elsevier Inc. ° °

## 2017-08-12 ENCOUNTER — Encounter: Payer: Self-pay | Admitting: Emergency Medicine

## 2017-08-12 ENCOUNTER — Ambulatory Visit
Admission: EM | Admit: 2017-08-12 | Discharge: 2017-08-12 | Disposition: A | Discharge status: Home / Self Care | Payer: Medicare HMO | Attending: Family Medicine | Admitting: Family Medicine

## 2017-08-12 ENCOUNTER — Other Ambulatory Visit: Payer: Self-pay

## 2017-08-12 DIAGNOSIS — J329 Chronic sinusitis, unspecified: Secondary | ICD-10-CM | POA: Diagnosis not present

## 2017-08-12 LAB — RAPID STREP SCREEN (MED CTR MEBANE ONLY): Streptococcus, Group A Screen (Direct): NEGATIVE

## 2017-08-12 MED ORDER — DOXYCYCLINE HYCLATE 100 MG PO CAPS: 100.0000 mg | ORAL_CAPSULE | Freq: Two times a day (BID) | ORAL | 0 refills | Status: DC

## 2017-08-12 MED ORDER — IPRATROPIUM BROMIDE 0.06 % NA SOLN: 2.0000 | Freq: Four times a day (QID) | NASAL | 1 refills | Status: AC

## 2017-08-12 NOTE — ED Provider Notes (Signed)
[facility]    CSN: [id number] Arrival date & time: 08/12/17  1148  History   Chief Complaint Chief Complaint  Patient presents with  . Sore Throat   HPI   47 year old female presents with respiratory symptoms.  Patient states that she is been sick since Monday.  She has had severe sore throat, cough, and congestion.  Patient states that she has ongoing allergies.  She is taken Mucinex without improvement.  No documented fever although her temperature is mildly elevated today.  No reported sick contacts.  No known relieving factors.  Exacerbating factor: Allergies.  No other associated symptoms.  No other complaints  Past Medical History:  Diagnosis Date  . Hypertension   . MI (myocardial infarction) (HCC) 2016  . Seizures (HCC)    Past Surgical History:  Procedure Laterality Date  . CARDIAC CATHETERIZATION  2016   possible   . LEFT HEART CATH AND CORONARY ANGIOGRAPHY Left 04/07/2017   Procedure: LEFT HEART CATH AND CORONARY ANGIOGRAPHY;  Surgeon: Marcina Millard, MD;  Location: [facility] INVASIVE CV LAB;  Service: Cardiovascular;  Laterality: Left;  . NO PAST SURGERIES      OB History   None      Home Medications    Prior to Admission medications   Medication Sig Start Date End Date Taking? Authorizing Provider  aspirin EC 81 MG tablet Take 81 mg by mouth daily.   Yes [provider]  atenolol (TENORMIN) 50 MG tablet Take 100 mg by mouth daily. Takes 2 tablets   Yes [provider]  Black Cohosh 540 MG CAPS Take 540 mg by mouth daily.   Yes [provider]  carbamazepine (TEGRETOL) 200 MG tablet TAKE 1 TABLET BY MOUTH ONCE  A DAY 11/24/12  Yes [provider]  diphenhydrAMINE (BENADRYL) 25 mg capsule Take 25 mg by mouth daily.   Yes [provider]  ferrous sulfate 325 (65 FE) MG tablet Take 325 mg by mouth daily with breakfast.   Yes [provider]  Garlic 1000 MG CAPS Take 1,000 mg by mouth daily.    Yes [provider]  Ginkgo Biloba Extract 60 MG CAPS Take 60 mg by mouth daily.   Yes [provider]  ibuprofen (ADVIL,MOTRIN) 200 MG tablet Take 400-600 mg by mouth every 8 (eight) hours as needed for headache or mild pain (depends on pain if takes 2-3 tablets).   Yes [provider]  isosorbide mononitrate (IMDUR) 60 MG 24 hr tablet Take 60 mg by mouth daily.   Yes [provider]  Multiple Vitamins-Minerals (ALIVE WOMENS ENERGY) TABS Take 1 tablet by mouth daily.   Yes [provider]  naproxen (NAPROSYN) 500 MG tablet Take 1 tablet (500 mg total) by mouth 2 (two) times daily. 09/18/15  Yes Domenick Gong, MD  nitroGLYCERIN (NITROSTAT) 0.4 MG SL tablet PLACE 1 TABLET UNDER THE TONGUE EVERY 5 MINUTES AS NEEDED FOR CHEST PAIN MAY TAKE UP TO 3 DOSES. 03/29/17  Yes [provider]  Omega-3 Fatty Acids (FISH OIL PO) Take 1 capsule by mouth daily.   Yes [provider]  simvastatin (ZOCOR) 40 MG tablet Take 40 mg by mouth daily.   Yes [provider]  traMADol (ULTRAM) 50 MG tablet 1-2 tabs po q 6 hr prn pain Maximum dose= 8 tablets per day 09/18/15  Yes Domenick Gong, MD  doxycycline (VIBRAMYCIN) 100 MG capsule Take 1 capsule (100 mg total) by mouth 2 (two) times daily. 08/12/17  Ciaran Begay G, DO  ipratropium (ATROVENT) 0.06 % nasal spray Place 2 sprays into both nostrils 4 (four) times daily. 08/12/17   Tommie Sams, DO  omeprazole (PRILOSEC) 40 MG capsule Take 40 mg by mouth daily.    [provider]    Family History Family History  Problem Relation Age of Onset  . Hypertension Father   . Heart failure Father   . Hyperlipidemia Father     Social History Social History   Tobacco Use  . Smoking status: Never Smoker  . Smokeless tobacco: Never Used  Substance Use Topics  . Alcohol use: No  . Drug use: No     Allergies   Penicillins   Review of Systems Review of Systems  Constitutional:  Negative for fever.  HENT: Positive for congestion and sore throat.   Respiratory: Positive for cough.    Physical Exam Triage Vital Signs ED Triage Vitals  Enc Vitals Group     BP 08/12/17 1206 (!) 133/92     Pulse Rate 08/12/17 1206 79     Resp 08/12/17 1206 18     Temp 08/12/17 1206 99.5 F (37.5 C)     Temp Source 08/12/17 1206 Oral     SpO2 08/12/17 1206 100 %     Weight 08/12/17 1203 150 lb (68 kg)     Height 08/12/17 1203 5' (1.524 m)     Head Circumference --      Peak Flow --      Pain Score 08/12/17 1202 8     Pain Loc --      Pain Edu? --      Excl. in GC? --    Updated Vital Signs BP (!) 133/92 (BP Location: Left Arm)   Pulse 79   Temp 99.5 F (37.5 C) (Oral)   Resp 18   Ht 5' (1.524 m)   Wt 150 lb (68 kg)   SpO2 100%   BMI 29.29 kg/m   Physical Exam  Constitutional: She is oriented to person, place, and time. She appears well-developed. No distress.  HENT:  Head: Normocephalic and atraumatic.  Oropharynx with erythema. No exudate. TM's with scarring.   Eyes: Conjunctivae are normal. Right eye exhibits no discharge. Left eye exhibits no discharge.  Cardiovascular: Normal rate and regular rhythm.  Pulmonary/Chest: Effort normal and breath sounds normal. She has no wheezes. She has no rales.  Neurological: She is alert and oriented to person, place, and time.  Psychiatric: She has a normal mood and affect. Her behavior is normal.  Nursing note and vitals reviewed.  UC Treatments / Results  Labs (all labs ordered are listed, but only abnormal results are displayed) Labs Reviewed  RAPID STREP SCREEN ([facility] ONLY)  CULTURE, GROUP A STREP [facility])    EKG None  Radiology No results found.  Procedures Procedures (including critical care time)  Medications Ordered in UC Medications - No data to display  Initial Impression / Assessment and Plan / UC Course  I have reviewed the triage vital signs and the nursing notes.  Pertinent labs &  imaging results that were available during my care of the patient were reviewed by me and considered in my medical decision making (see chart for details).    47 year old female presents with rhinosinusitis. Treating with Doxy and atrovent.  Final Clinical Impressions(s) / UC Diagnoses   Final diagnoses:  Rhinosinusitis     Discharge Instructions     Antibiotic and nasal  spray as prescribed.  Take care  Dr. Adriana Simas     ED Prescriptions    Medication Sig Dispense Auth. Provider   doxycycline (VIBRAMYCIN) 100 MG capsule Take 1 capsule (100 mg total) by mouth 2 (two) times daily. 14 capsule Davine Coba G, DO   ipratropium (ATROVENT) 0.06 % nasal spray Place 2 sprays into both nostrils 4 (four) times daily. 15 mL Tommie Sams, DO     Controlled Substance Prescriptions [place] Controlled Substance Registry consulted? Not Applicable   Tommie Sams, DO 08/12/17 1610

## 2017-08-12 NOTE — ED Triage Notes (Signed)
Patient reports that she has tried Mucinex and gargling with warm salt water with no relief.

## 2017-08-12 NOTE — Discharge Instructions (Signed)
Antibiotic and nasal spray as prescribed.  Take care  Dr. Adriana Simas

## 2017-08-12 NOTE — ED Triage Notes (Signed)
Patient c/o sore throat that started Monday. Also reports cough and congestion that started Monday.

## 2017-08-15 LAB — CULTURE, GROUP A STREP (THRC)

## 2018-02-11 ENCOUNTER — Ambulatory Visit (INDEPENDENT_AMBULATORY_CARE_PROVIDER_SITE_OTHER): Payer: 59

## 2018-02-11 ENCOUNTER — Encounter: Payer: Self-pay | Admitting: Emergency Medicine

## 2018-02-11 ENCOUNTER — Ambulatory Visit
Admission: EM | Admit: 2018-02-11 | Discharge: 2018-02-11 | Disposition: A | Discharge status: Home / Self Care | Payer: 59 | Attending: Family Medicine | Admitting: Family Medicine

## 2018-02-11 ENCOUNTER — Other Ambulatory Visit: Payer: Self-pay

## 2018-02-11 DIAGNOSIS — R0602 Shortness of breath: Secondary | ICD-10-CM | POA: Diagnosis not present

## 2018-02-11 DIAGNOSIS — R0789 Other chest pain: Secondary | ICD-10-CM | POA: Insufficient documentation

## 2018-02-11 DIAGNOSIS — R05 Cough: Secondary | ICD-10-CM

## 2018-02-11 MED ORDER — BENZONATATE 100 MG PO CAPS: 100.0000 mg | ORAL_CAPSULE | Freq: Three times a day (TID) | ORAL | 0 refills | Status: DC

## 2018-02-11 MED ORDER — DICLOFENAC SODIUM 50 MG PO TBEC: 50.0000 mg | DELAYED_RELEASE_TABLET | Freq: Three times a day (TID) | ORAL | 0 refills | Status: DC | PRN

## 2018-02-11 NOTE — ED Provider Notes (Signed)
[facility]    CSN: [id number] Arrival date & time: 02/11/18  3159     History   Chief Complaint Chief Complaint  Patient presents with  . Muscle Pain    HPI Samantha Watson is a 47 y.o. female.   Subjective:  Samantha Watson is a 47 y.o. female who presents for evaluation of chest wall pain. Onset was 3 days ago. Symptoms have been unchanged since that time. The patient describes the pain as aching in the costochondral region: bilaterally. Patient rates pain as a 7/10 in intensity. Associated symptoms are: cough and shortness of breath. Aggravating factors are: coughing, deep inspiration, palpation of chest. Alleviating factors are: none. Mechanism of injury: coughed violently. Previous visits for this problem: none. Evaluation to date: none. Treatment to date: none.  The following portions of the patient's history were reviewed and updated as appropriate: allergies, current medications, past family history, past medical history, past social history, past surgical history and problem list.         Past Medical History:  Diagnosis Date  . Hypertension   . MI (myocardial infarction) (HCC) 2016  . Seizures (HCC)     There are no active problems to display for this patient.   Past Surgical History:  Procedure Laterality Date  . CARDIAC CATHETERIZATION  2016   possible   . LEFT HEART CATH AND CORONARY ANGIOGRAPHY Left 04/07/2017   Procedure: LEFT HEART CATH AND CORONARY ANGIOGRAPHY;  Surgeon: Marcina Millard, MD;  Location: [facility] INVASIVE CV LAB;  Service: Cardiovascular;  Laterality: Left;  . NO PAST SURGERIES      OB History   No obstetric history on file.      Home Medications    Prior to Admission medications   Medication Sig Start Date End Date Taking? Authorizing Provider  aspirin EC 81 MG tablet Take 81 mg by mouth daily.   Yes [provider]  atenolol (TENORMIN) 50 MG tablet Take 100 mg by mouth daily. Takes 2 tablets   Yes  [provider]  Black Cohosh 540 MG CAPS Take 540 mg by mouth daily.   Yes [provider]  carbamazepine (TEGRETOL) 200 MG tablet TAKE 1 TABLET BY MOUTH ONCE  A DAY 11/24/12  Yes [provider]  diphenhydrAMINE (BENADRYL) 25 mg capsule Take 25 mg by mouth daily.   Yes [provider]  ferrous sulfate 325 (65 FE) MG tablet Take 325 mg by mouth daily with breakfast.   Yes [provider]  Garlic 1000 MG CAPS Take 1,000 mg by mouth daily.   Yes [provider]  Ginkgo Biloba Extract 60 MG CAPS Take 60 mg by mouth daily.   Yes [provider]  ipratropium (ATROVENT) 0.06 % nasal spray Place 2 sprays into both nostrils 4 (four) times daily. 08/12/17  Yes Cook, Jayce G, DO  isosorbide mononitrate (IMDUR) 60 MG 24 hr tablet Take 60 mg by mouth daily.   Yes [provider]  Multiple Vitamins-Minerals (ALIVE WOMENS ENERGY) TABS Take 1 tablet by mouth daily.   Yes [provider]  nitroGLYCERIN (NITROSTAT) 0.4 MG SL tablet PLACE 1 TABLET UNDER THE TONGUE EVERY 5 MINUTES AS NEEDED FOR CHEST PAIN MAY TAKE UP TO 3 DOSES. 03/29/17  Yes [provider]  Omega-3 Fatty Acids (FISH OIL PO) Take 1 capsule by mouth daily.   Yes [provider]  omeprazole (PRILOSEC) 40 MG capsule Take 40 mg by mouth daily.   Yes [provider]  simvastatin (ZOCOR) 40 MG tablet Take 40 mg by mouth daily.   Yes [provider]  traMADol (ULTRAM) 50 MG tablet 1-2 tabs po q 6 hr prn pain Maximum dose= 8 tablets per day 09/18/15  Yes Domenick GongMortenson, Ashley, MD  benzonatate (TESSALON) 100 MG capsule Take 1 capsule (100 mg total) by mouth every 8 (eight) hours. 02/11/18   Lurline IdolMurrill, Adin Lariccia, FNP  diclofenac (VOLTAREN) 50 MG EC tablet Take 1 tablet (50 mg total) by mouth 3 (three) times daily as needed (PAIN). 02/11/18   Lurline IdolMurrill, Elliette Seabolt, FNP    Family History Family History  Problem Relation Age of Onset  . Hypertension Father     . Heart failure Father   . Hyperlipidemia Father     Social History Social History   Tobacco Use  . Smoking status: Never Smoker  . Smokeless tobacco: Never Used  Substance Use Topics  . Alcohol use: No  . Drug use: No     Allergies   Penicillins   Review of Systems Review of Systems  Constitutional: Negative.   HENT: Positive for congestion.   Respiratory: Positive for cough. Negative for shortness of breath and wheezing.   Cardiovascular: Positive for chest pain. Negative for palpitations and leg swelling.  Gastrointestinal: Negative.   Neurological: Negative.   All other systems reviewed and are negative.    Physical Exam Triage Vital Signs ED Triage Vitals  Enc Vitals Group     BP 02/11/18 0954 (!) 160/105     Pulse Rate 02/11/18 0954 65     Resp 02/11/18 0954 18     Temp 02/11/18 0954 98.2 F (36.8 C)     Temp Source 02/11/18 0954 Oral     SpO2 02/11/18 0954 100 %     Weight 02/11/18 0955 150 lb (68 kg)     Height 02/11/18 0955 5' (1.524 m)     Head Circumference --      Peak Flow --      Pain Score 02/11/18 0954 7     Pain Loc --      Pain Edu? --      Excl. in GC? --    No data found.  Updated Vital Signs BP (!) 160/105 (BP Location: Left Arm)   Pulse 65   Temp 98.2 F (36.8 C) (Oral)   Resp 18   Ht 5' (1.524 m)   Wt 150 lb (68 kg)   SpO2 100%   BMI 29.29 kg/m   Visual Acuity Right Eye Distance:   Left Eye Distance:   Bilateral Distance:    Right Eye Near:   Left Eye Near:    Bilateral Near:     Physical Exam Constitutional:      General: She is not in acute distress.    Appearance: Normal appearance. She is not ill-appearing, toxic-appearing or diaphoretic.  HENT:     Head: Normocephalic.     Mouth/Throat:     Mouth: Mucous membranes are moist.  Neck:     Musculoskeletal: Normal range of motion and neck supple.  Cardiovascular:     Rate and Rhythm: Normal rate and regular rhythm.     Heart sounds: Normal heart sounds.   Pulmonary:     Effort: Pulmonary effort is normal. No respiratory distress.     Breath sounds: Normal breath sounds.  Chest:     Chest wall: Tenderness present. No mass, deformity, swelling, crepitus or edema.  Musculoskeletal: Normal range of motion.  Lymphadenopathy:  Cervical: No cervical adenopathy.  Skin:    General: Skin is warm and dry.  Neurological:     General: No focal deficit present.     Mental Status: She is alert and oriented to person, place, and time.  Psychiatric:        Mood and Affect: Mood normal.        Behavior: Behavior normal.      UC Treatments / Results  Labs (all labs ordered are listed, but only abnormal results are displayed) Labs Reviewed - No data to display  EKG None  Radiology Dg Chest 2 View  Result Date: 02/11/2018 CLINICAL DATA:  Cough and chest pain EXAM: CHEST - 2 VIEW COMPARISON:  December 24, 2012 FINDINGS: No edema or consolidation. The heart size and pulmonary vascularity are normal. No adenopathy. No pneumothorax. No bone lesions. IMPRESSION: No edema or consolidation. Electronically Signed   By: Bretta Bang III M.D.   On: 02/11/2018 10:33    Procedures Procedures (including critical care time)  Medications Ordered in UC Medications - No data to display  Initial Impression / Assessment and Plan / UC Course  I have reviewed the triage vital signs and the nursing notes.  Pertinent labs & imaging results that were available during my care of the patient were reviewed by me and considered in my medical decision making (see chart for details).     47 yo female presenting with chest wall pain that is worse with coughing and deep breathing.  She has had URI recently and actually had an episode of violently coughing couple days prior to symptom onset after accidentally choking on some water.  Patient is afebrile.  No acute distress.  No wheezing or other adventitious lung sounds. Normal chest x-ray.  Tums likely due to  acute chest wall pain.  Discussed diagnosis and the fact that prolonged recovery is quite possible.  Patient is to take medications as prescribed. Encouraged pulmonary toileting to prevent pulmonary issues.  Follow-up as needed.  Today's evaluation has revealed no signs of a dangerous process. Discussed diagnosis with patient. Patient aware of their diagnosis, possible red flag symptoms to watch out for and need for close follow up. Patient understands verbal and written discharge instructions. Patient comfortable with plan and disposition.  Patient has a clear mental status at this time, good insight into illness (after discussion and teaching) and has clear judgment to make decisions regarding their care.  Documentation was completed with the aid of voice recognition software. Transcription may contain typographical errors.  Final Clinical Impressions(s) / UC Diagnoses   Final diagnoses:  Chest wall pain     Discharge Instructions     Take medications as prescribed. Follow-up with your primary care provider as needed.     ED Prescriptions    Medication Sig Dispense Auth. Provider   benzonatate (TESSALON) 100 MG capsule Take 1 capsule (100 mg total) by mouth every 8 (eight) hours. 21 capsule Lurline Idol, FNP   diclofenac (VOLTAREN) 50 MG EC tablet Take 1 tablet (50 mg total) by mouth 3 (three) times daily as needed (PAIN). 21 tablet Lurline Idol, FNP     Controlled Substance Prescriptions  Controlled Substance Registry consulted? Not Applicable   Lurline Idol, FNP 02/11/18 1059

## 2018-02-11 NOTE — Discharge Instructions (Signed)
Take medications as prescribed. Follow-up with your primary care provider as needed.

## 2018-02-11 NOTE — ED Triage Notes (Signed)
Patient c/o pain in her ribs. She has been coughing some and reports that when she coughs her ribs hurt.

## 2018-07-10 ENCOUNTER — Emergency Department
Admission: EM | Admit: 2018-07-10 | Discharge: 2018-07-10 | Disposition: A | Discharge status: Home / Self Care | Payer: 59 | Attending: Emergency Medicine | Admitting: Emergency Medicine

## 2018-07-10 ENCOUNTER — Other Ambulatory Visit: Payer: Self-pay

## 2018-07-10 ENCOUNTER — Ambulatory Visit
Admission: EM | Admit: 2018-07-10 | Discharge: 2018-07-10 | Disposition: A | Discharge status: Home / Self Care | Payer: 59 | Source: Home / Self Care | Attending: Family Medicine | Admitting: Family Medicine

## 2018-07-10 ENCOUNTER — Emergency Department: Payer: 59

## 2018-07-10 DIAGNOSIS — I10 Essential (primary) hypertension: Secondary | ICD-10-CM

## 2018-07-10 DIAGNOSIS — Z79899 Other long term (current) drug therapy: Secondary | ICD-10-CM | POA: Diagnosis not present

## 2018-07-10 DIAGNOSIS — L03113 Cellulitis of right upper limb: Secondary | ICD-10-CM

## 2018-07-10 DIAGNOSIS — Z7982 Long term (current) use of aspirin: Secondary | ICD-10-CM | POA: Insufficient documentation

## 2018-07-10 DIAGNOSIS — M79631 Pain in right forearm: Secondary | ICD-10-CM | POA: Diagnosis present

## 2018-07-10 DIAGNOSIS — M79639 Pain in unspecified forearm: Secondary | ICD-10-CM

## 2018-07-10 LAB — CBC WITH DIFFERENTIAL/PLATELET
Abs Immature Granulocytes: 0.07 10*3/uL (ref 0.00–0.07)
Basophils Absolute: 0 10*3/uL (ref 0.0–0.1)
Basophils Relative: 0 %
Eosinophils Absolute: 0.1 10*3/uL (ref 0.0–0.5)
Eosinophils Relative: 0 %
HCT: 36.2 % (ref 36.0–46.0)
Hemoglobin: 12.2 g/dL (ref 12.0–15.0)
Immature Granulocytes: 1 %
Lymphocytes Relative: 19 %
Lymphs Abs: 2.6 10*3/uL (ref 0.7–4.0)
MCH: 30.6 pg (ref 26.0–34.0)
MCHC: 33.7 g/dL (ref 30.0–36.0)
MCV: 90.7 fL (ref 80.0–100.0)
Monocytes Absolute: 1.2 10*3/uL — ABNORMAL HIGH (ref 0.1–1.0)
Monocytes Relative: 9 %
Neutro Abs: 10.3 10*3/uL — ABNORMAL HIGH (ref 1.7–7.7)
Neutrophils Relative %: 71 %
Platelets: 290 10*3/uL (ref 150–400)
RBC: 3.99 MIL/uL (ref 3.87–5.11)
RDW: 11.9 % (ref 11.5–15.5)
WBC: 14.3 10*3/uL — ABNORMAL HIGH (ref 4.0–10.5)
nRBC: 0 % (ref 0.0–0.2)

## 2018-07-10 LAB — COMPREHENSIVE METABOLIC PANEL
ALT: 27 U/L (ref 0–44)
AST: 23 U/L (ref 15–41)
Albumin: 4.4 g/dL (ref 3.5–5.0)
Alkaline Phosphatase: 61 U/L (ref 38–126)
Anion gap: 9 (ref 5–15)
BUN: 10 mg/dL (ref 6–20)
CO2: 27 mmol/L (ref 22–32)
Calcium: 8.7 mg/dL — ABNORMAL LOW (ref 8.9–10.3)
Chloride: 104 mmol/L (ref 98–111)
Creatinine, Ser: 0.43 mg/dL — ABNORMAL LOW (ref 0.44–1.00)
GFR calc Af Amer: >60 mL/min (ref >60)
GFR calc non Af Amer: >60 mL/min (ref >60)
Glucose, Bld: 117 mg/dL — ABNORMAL HIGH (ref 70–99)
Potassium: 3.6 mmol/L (ref 3.5–5.1)
Sodium: 140 mmol/L (ref 135–145)
Total Bilirubin: 0.3 mg/dL (ref 0.3–1.2)
Total Protein: 8 g/dL (ref 6.5–8.1)

## 2018-07-10 LAB — LACTIC ACID, PLASMA: Lactic Acid, Venous: 0.8 mmol/L (ref 0.5–1.9)

## 2018-07-10 MED ORDER — CLINDAMYCIN HCL 300 MG PO CAPS: 300.0000 mg | ORAL_CAPSULE | Freq: Three times a day (TID) | ORAL | 0 refills | Status: AC

## 2018-07-10 MED ORDER — CLINDAMYCIN PHOSPHATE 600 MG/50ML IV SOLN
600.0000 mg | Freq: Once | INTRAVENOUS | Status: AC
  Administered 2018-07-10: 600 mg via INTRAVENOUS
  Filled 2018-07-10: qty 50

## 2018-07-10 NOTE — ED Provider Notes (Signed)
[facility]    CSN: [id number] Arrival date & time: 07/10/18  1245     History   Chief Complaint Chief Complaint  Patient presents with  . Insect Bite    HPI Samantha Watson is a 48 y.o. female.   49 yo female with a c/o right forearm insect bite sometime last week with progressive redness, swelling, pain and chills. Has not checked her temperature at home. States area has been slightly draining.      Past Medical History:  Diagnosis Date  . Hypertension   . MI (myocardial infarction) (HCC) 2016  . Seizures (HCC)     There are no active problems to display for this patient.   Past Surgical History:  Procedure Laterality Date  . CARDIAC CATHETERIZATION  2016   possible   . LEFT HEART CATH AND CORONARY ANGIOGRAPHY Left 04/07/2017   Procedure: LEFT HEART CATH AND CORONARY ANGIOGRAPHY;  Surgeon: Marcina Millard, MD;  Location: [facility] IN[facility];  Service: Cardiovascular;  Laterality: Left;  . NO PAST SURGERIES      OB History   No obstetric history on file.      Home Medications    Prior to Admission medications   Medication Sig Start Date End Date Taking? Authorizing Provider  aspirin EC 81 MG tablet Take 81 mg by mouth daily.   Yes [provider]  atenolol (TENORMIN) 50 MG tablet Take 100 mg by mouth daily. Takes 2 tablets   Yes [provider]  Black Cohosh 540 MG CAPS Take 540 mg by mouth daily.   Yes [provider]  carbamazepine (TEGRETOL) 200 MG tablet TAKE 1 TABLET BY MOUTH ONCE  A DAY 11/24/12  Yes [provider]  diclofenac (VOLTAREN) 50 MG EC tablet Take 1 tablet (50 mg total) by mouth 3 (three) times daily as needed (PAIN). 02/11/18  Yes Lurline Idol, FNP  diphenhydrAMINE (BENADRYL) 25 mg capsule Take 25 mg by mouth daily.   Yes [provider]  ferrous sulfate 325 (65 FE) MG tablet Take 325 mg by mouth daily with breakfast.   Yes [provider]  Garlic 1000 MG CAPS Take  1,000 mg by mouth daily.   Yes [provider]  Ginkgo Biloba Extract 60 MG CAPS Take 60 mg by mouth daily.   Yes [provider]  ipratropium (ATROVENT) 0.06 % nasal spray Place 2 sprays into both nostrils 4 (four) times daily. 08/12/17  Yes Cook, Jayce G, DO  isosorbide mononitrate (IMDUR) 60 MG 24 hr tablet Take 60 mg by mouth daily.   Yes [provider]  Multiple Vitamins-Minerals (ALIVE WOMENS ENERGY) TABS Take 1 tablet by mouth daily.   Yes [provider]  nitroGLYCERIN (NITROSTAT) 0.4 MG SL tablet PLACE 1 TABLET UNDER THE TONGUE EVERY 5 MINUTES AS NEEDED FOR CHEST PAIN MAY TAKE UP TO 3 DOSES. 03/29/17  Yes [provider]  Omega-3 Fatty Acids (FISH OIL PO) Take 1 capsule by mouth daily.   Yes [provider]  omeprazole (PRILOSEC) 40 MG capsule Take 40 mg by mouth daily.   Yes [provider]  simvastatin (ZOCOR) 40 MG tablet Take 40 mg by mouth daily.   Yes [provider]  traMADol (ULTRAM) 50 MG tablet 1-2 tabs po q 6 hr prn pain Maximum dose= 8 tablets per day 09/18/15  Yes Domenick Gong, MD  benzonatate (TESSALON) 100 MG capsule Take 1 capsule (100 mg total) by mouth every 8 (eight) hours. 02/11/18  Lurline Idol, FNP    Family History Family History  Problem Relation Age of Onset  . Hypertension Father   . Heart failure Father   . Hyperlipidemia Father     Social History Social History   Tobacco Use  . Smoking status: Never Smoker  . Smokeless tobacco: Never Used  Substance Use Topics  . Alcohol use: No  . Drug use: No     Allergies   Penicillins   Review of Systems Review of Systems   Physical Exam Triage Vital Signs ED Triage Vitals  Enc Vitals Group     BP 07/10/18 1303 (!) 136/94     Pulse Rate 07/10/18 1301 77     Resp 07/10/18 1301 18     Temp 07/10/18 1301 98.5 F (36.9 C)     Temp Source 07/10/18 1301 Oral     SpO2 07/10/18 1301 99 %     Weight 07/10/18 1303 150 lb  (68 kg)     Height 07/10/18 1303 5' (1.524 m)     Head Circumference --      Peak Flow --      Pain Score 07/10/18 1303 9     Pain Loc --      Pain Edu? --      Excl. in GC? --    No data found.  Updated Vital Signs BP (!) 136/94 (BP Location: Left Arm)   Pulse 77   Temp 98.5 F (36.9 C) (Oral)   Resp 18   Ht 5' (1.524 m)   Wt 68 kg   SpO2 99%   BMI 29.29 kg/m   Visual Acuity Right Eye Distance:   Left Eye Distance:   Bilateral Distance:    Right Eye Near:   Left Eye Near:    Bilateral Near:     Physical Exam Vitals signs and nursing note reviewed.  Constitutional:      General: She is not in acute distress.    Appearance: She is not toxic-appearing or diaphoretic.  Musculoskeletal:     Right forearm: She exhibits swelling and edema.     Comments: Right upper extremity neurovascularly intact; approx 3-4 cm induration to distal forearm with tenderness to palpation, warmth, and extensive blanchable erythema extending and streaking proximally towards the elbow crease  Neurological:     Mental Status: She is alert.      UC Treatments / Results  Labs (all labs ordered are listed, but only abnormal results are displayed) Labs Reviewed - No data to display  EKG None  Radiology No results found.  Procedures Procedures (including critical care time)  Medications Ordered in UC Medications - No data to display  Initial Impression / Assessment and Plan / UC Course  I have reviewed the triage vital signs and the nursing notes.  Pertinent labs & imaging results that were available during my care of the patient were reviewed by me and considered in my medical decision making (see chart for details).      Final Clinical Impressions(s) / UC Diagnoses   Final diagnoses:  Right arm cellulitis     Discharge Instructions     Due to extensiveness/progression, recommend patient go to Emergency Department for further evaluation and management    ED  Prescriptions    None     1. diagnosis reviewed with patient 2. Recommendation as above  Controlled Substance Prescriptions [place] Controlled Substance Registry consulted? Not Applicable   Payton Mccallum, MD 07/10/18 1414

## 2018-07-10 NOTE — ED Triage Notes (Signed)
Pt states "I got bit by something" pt has a red raised area to the right FA since Friday.

## 2018-07-10 NOTE — Discharge Instructions (Signed)
Due to extensiveness/progression, recommend patient go to Emergency Department for further evaluation and management

## 2018-07-10 NOTE — ED Triage Notes (Signed)
Pt states she noticed on Friday that she had an  Insect bite on the inside of her right arm right below her wrist and it is red and swollen and painful. Has been draining some and has been applying peroxide and alcohol on it. Unsure if ant or spider bit her

## 2018-07-10 NOTE — ED Provider Notes (Signed)
[facility] Emergency Department Provider Note  ____________________________________________  Time seen: Approximately 11:17 PM  I have reviewed the triage vital signs and the nursing notes.   HISTORY  Chief Complaint Abscess    HPI Samantha Watson is a 48 y.o. female presents to the emergency department with erythema and pain along the right upper extremity that patient has noticed for the past 4 to 5 days.  Patient has had chills at home.  She denies a history of abscesses or IV drug use.  She has never had similar symptoms in the past.        Past Medical History:  Diagnosis Date  . Hypertension   . MI (myocardial infarction) (HCC) 2016  . Seizures (HCC)     There are no active problems to display for this patient.   Past Surgical History:  Procedure Laterality Date  . CARDIAC CATHETERIZATION  2016   possible   . LEFT HEART CATH AND CORONARY ANGIOGRAPHY Left 04/07/2017   Procedure: LEFT HEART CATH AND CORONARY ANGIOGRAPHY;  Surgeon: Marcina Millard, MD;  Location: [facility] INVASIVE CV LAB;  Service: Cardiovascular;  Laterality: Left;  . NO PAST SURGERIES      Prior to Admission medications   Medication Sig Start Date End Date Taking? Authorizing Provider  aspirin EC 81 MG tablet Take 81 mg by mouth daily.    [provider]  atenolol (TENORMIN) 50 MG tablet Take 100 mg by mouth daily. Takes 2 tablets    [provider]  benzonatate (TESSALON) 100 MG capsule Take 1 capsule (100 mg total) by mouth every 8 (eight) hours. 02/11/18   Lurline Idol, FNP  Black Cohosh 540 MG CAPS Take 540 mg by mouth daily.    [provider]  carbamazepine (TEGRETOL) 200 MG tablet TAKE 1 TABLET BY MOUTH ONCE  A DAY 11/24/12   [provider]  clindamycin (CLEOCIN) 300 MG capsule Take 1 capsule (300 mg total) by mouth 3 (three) times daily for 10 days. 07/10/18 07/20/18  Orvil Feil, PA-C  diclofenac (VOLTAREN) 50 MG EC tablet  Take 1 tablet (50 mg total) by mouth 3 (three) times daily as needed (PAIN). 02/11/18   Lurline Idol, FNP  diphenhydrAMINE (BENADRYL) 25 mg capsule Take 25 mg by mouth daily.    [provider]  ferrous sulfate 325 (65 FE) MG tablet Take 325 mg by mouth daily with breakfast.    [provider]  Garlic 1000 MG CAPS Take 1,000 mg by mouth daily.    [provider]  Ginkgo Biloba Extract 60 MG CAPS Take 60 mg by mouth daily.    [provider]  ipratropium (ATROVENT) 0.06 % nasal spray Place 2 sprays into both nostrils 4 (four) times daily. 08/12/17   Tommie Sams, DO  isosorbide mononitrate (IMDUR) 60 MG 24 hr tablet Take 60 mg by mouth daily.    [provider]  Multiple Vitamins-Minerals (ALIVE WOMENS ENERGY) TABS Take 1 tablet by mouth daily.    [provider]  nitroGLYCERIN (NITROSTAT) 0.4 MG SL tablet PLACE 1 TABLET UNDER THE TONGUE EVERY 5 MINUTES AS NEEDED FOR CHEST PAIN MAY TAKE UP TO 3 DOSES. 03/29/17   [provider]  Omega-3 Fatty Acids (FISH OIL PO) Take 1 capsule by mouth daily.    [provider]  omeprazole (PRILOSEC) 40 MG capsule Take 40 mg by mouth daily.    [provider]  simvastatin (ZOCOR) 40 MG tablet Take 40 mg by  mouth daily.    [provider]  traMADol (ULTRAM) 50 MG tablet 1-2 tabs po q 6 hr prn pain Maximum dose= 8 tablets per day 09/18/15   Domenick GongMortenson, Ashley, MD    Allergies Penicillins  Family History  Problem Relation Age of Onset  . Hypertension Father   . Heart failure Father   . Hyperlipidemia Father     Social History Social History   Tobacco Use  . Smoking status: Never Smoker  . Smokeless tobacco: Never Used  Substance Use Topics  . Alcohol use: No  . Drug use: No     Review of Systems  Constitutional: No fever/chills Eyes: No visual changes. No discharge ENT: No upper respiratory complaints. Cardiovascular: no chest pain. Respiratory: no cough.  No SOB. Gastrointestinal: No abdominal pain.  No nausea, no vomiting.  No diarrhea.  No constipation. Musculoskeletal: Negative for musculoskeletal pain. Skin: Patient has cellulitis of right upper extremity. Neurological: Negative for headaches, focal weakness or numbness.   ____________________________________________   PHYSICAL EXAM:  VITAL SIGNS: ED Triage Vitals  Enc Vitals Group     BP 07/10/18 1355 (!) 158/88     Pulse Rate 07/10/18 1355 73     Resp 07/10/18 1355 17     Temp 07/10/18 1405 98 F (36.7 C)     Temp Source 07/10/18 1355 Oral     SpO2 07/10/18 1355 100 %     Weight 07/10/18 1355 150 lb (68 kg)     Height 07/10/18 1355 5' (1.524 m)     Head Circumference --      Peak Flow --      Pain Score 07/10/18 1355 8     Pain Loc --      Pain Edu? --      Excl. in GC? --      Constitutional: Alert and oriented. Well appearing and in no acute distress. Eyes: Conjunctivae are normal. PERRL. EOMI. Head: Atraumatic. Cardiovascular: Normal rate, regular rhythm. Normal S1 and S2.  Good peripheral circulation. Respiratory: Normal respiratory effort without tachypnea or retractions. Lungs CTAB. Good air entry to the bases with no decreased or absent breath sounds. Musculoskeletal: Full range of motion to all extremities. No gross deformities appreciated. Neurologic:  Normal speech and language. No gross focal neurologic deficits are appreciated.  Skin: Patient has 6 cm of circumferential cellulitis along right forearm.  There is associated induration but no palpable fluctuance. Psychiatric: Mood and affect are normal. Speech and behavior are normal. Patient exhibits appropriate insight and judgement.   ____________________________________________   LABS (all labs ordered are listed, but only abnormal results are displayed)  Labs Reviewed  CBC WITH DIFFERENTIAL/PLATELET - Abnormal; Notable for the following components:      Result Value   WBC 14.3 (*)    Neutro Abs  10.3 (*)    Monocytes Absolute 1.2 (*)    All other components within normal limits  COMPREHENSIVE METABOLIC PANEL - Abnormal; Notable for the following components:   Glucose, Bld 117 (*)    Creatinine, Ser 0.43 (*)    Calcium 8.7 (*)    All other components within normal limits  LACTIC ACID, PLASMA  LACTIC ACID, PLASMA   ____________________________________________  EKG   ____________________________________________  RADIOLOGY I personally viewed and evaluated these images as part of my medical decision making, as well as reviewing the written report by the radiologist.  [place] Rt Upper Extrem Ltd Soft Tissue Non Vascular  Result Date: 07/10/2018 CLINICAL DATA:  Forearm  pain. EXAM: ULTRASOUND right UPPER EXTREMITY LIMITED TECHNIQUE: Ultrasound examination of the upper extremity soft tissues was performed in the area of clinical concern. COMPARISON:  None FINDINGS: In the patient's palpable area of concern in the right forearm, there is an area of skin thickening and subcutaneous fat stranding. There is a small fluid collection deep to the cutaneous surface measuring approximately 0.5 cm. There is surrounding hyperemia. IMPRESSION: In the patient's palpable area of concern, there is skin thickening and hyperemic changes which can be seen in the setting of cellulitis. There is a small 0.5 cm complex collection deep to the skin surface that may represent a developing abscess. This collection appears to connect to the cutaneous surface on multiple sonographic images. Electronically Signed   By: Katherine Mantle M.D.   On: 07/10/2018 21:52    ____________________________________________    PROCEDURES  Procedure(s) performed:    Procedures  INCISION AND DRAINAGE Performed by: Orvil Feil Consent: Verbal consent obtained. Risks and benefits: risks, benefits and alternatives were discussed Type: abscess  Body area: Right forearm.   Anesthesia: local infiltration  Incision was  made with a scalpel.  Local anesthetic: lidocaine 1% without epinephrine  Anesthetic total: 2 ml  Complexity: complex Blunt dissection to break up loculations  Drainage: purulent  Drainage amount: Small  Patient tolerance: Patient tolerated the procedure well with no immediate complications.      Medications  clindamycin (CLEOCIN) IVPB 600 mg (0 mg Intravenous Stopped 07/10/18 1938)     ____________________________________________   INITIAL IMPRESSION / ASSESSMENT AND PLAN / ED COURSE  Pertinent labs & imaging results that were available during my care of the patient were reviewed by me and considered in my medical decision making (see chart for details).  Review of the [place] CSRS was performed in accordance of the [facility] prior to dispensing any controlled drugs.         Assessment and plan Cellulitis  Right arm pain 48 year old female presents to the emergency department with right forearm cellulitis and pain for the past 4 days.  On physical exam, patient had 6 cm of circumferential cellulitis with associated induration.  She had a low-grade fever in the emergency department and leukocytosis was evident on CBC, 14.3  Differential diagnosis included abscess versus cellulitis.  Patient had a soft tissue ultrasound of the right forearm in the emergency department and findings were consistent with early abscess formation.  Incision and drainage was performed in the emergency department and a very small amount of purulent exudate was expressed.  Patient was given IV clindamycin in the emergency department as she has penicillin allergy.  She was discharged with clindamycin.  Strict return precautions were given to return to the emergency department with worsening symptoms.  She voiced understanding.  All patient questions were answered.  ____________________________________________  FINAL CLINICAL IMPRESSION(S) / ED DIAGNOSES  Final diagnoses:  Forearm pain  Cellulitis of  right upper extremity      NEW MEDICATIONS STARTED DURING THIS VISIT:  ED Discharge Orders         Ordered    clindamycin (CLEOCIN) 300 MG capsule  3 times daily     07/10/18 2209              This chart was dictated using voice recognition software/Dragon. Despite best efforts to proofread, errors can occur which can change the meaning. Any change was purely unintentional.    Orvil Feil, PA-C 07/10/18 2324    Emily Filbert, MD 07/11/18  1637  

## 2018-07-10 NOTE — ED Notes (Signed)
Pt to ultrasound

## 2018-07-10 NOTE — ED Notes (Signed)
Pt up to restroom.

## 2018-10-30 ENCOUNTER — Ambulatory Visit
Admission: EM | Admit: 2018-10-30 | Discharge: 2018-10-30 | Disposition: A | Discharge status: Home / Self Care | Payer: 59 | Attending: Emergency Medicine | Admitting: Emergency Medicine

## 2018-10-30 ENCOUNTER — Other Ambulatory Visit: Payer: Self-pay

## 2018-10-30 ENCOUNTER — Encounter: Payer: Self-pay | Admitting: Emergency Medicine

## 2018-10-30 DIAGNOSIS — L03811 Cellulitis of head [any part, except face]: Secondary | ICD-10-CM | POA: Diagnosis not present

## 2018-10-30 MED ORDER — IBUPROFEN 600 MG PO TABS: 600.0000 mg | ORAL_TABLET | Freq: Four times a day (QID) | ORAL | 0 refills | Status: AC | PRN

## 2018-10-30 MED ORDER — DOXYCYCLINE HYCLATE 100 MG PO CAPS: 100.0000 mg | ORAL_CAPSULE | Freq: Two times a day (BID) | ORAL | 0 refills | Status: AC

## 2018-10-30 NOTE — ED Triage Notes (Addendum)
Patient c/o abscess on the back of her neck that she noticed yesterday.  Patient c/o HAs.

## 2018-10-30 NOTE — ED Provider Notes (Signed)
HPI  SUBJECTIVE:  Samantha Watson is a 48 y.o. female who presents with a painful erythematous mass of gradually increasing size along the back of her neck starting several days ago.  She got a haircut where her neck was shaved last week.  She describes the pain as constant, burning, sore.  She states that this is causing headaches.  She denies neck stiffness, fevers, insect bite.  She tried to pop it and was able to get a little bit of pus and blood out of it.  She has never had symptoms like this before in this area.  She tried ibuprofen 200 mg and popping it with a pin.  She has also tried cold compresses.  No alleviating factors.  Symptoms are worse with squeezing it and applying cool compresses.  Her tetanus is up-to-date.  She has a past medical history of seizures, hypertension, MI.  No history of diabetes, MRSA.  She is allergic to penicillins.  WUJ:[id number]:MacDonald, Lorina RabonKeri, NP   Past Medical History:  Diagnosis Date  . Hypertension   . MI (myocardial infarction) (HCC) 2016  . Seizures (HCC)     Past Surgical History:  Procedure Laterality Date  . CARDIAC CATHETERIZATION  2016   possible   . LEFT HEART CATH AND CORONARY ANGIOGRAPHY Left 04/07/2017   Procedure: LEFT HEART CATH AND CORONARY ANGIOGRAPHY;  Surgeon: Marcina MillardParaschos, Alexander, MD;  Location: [facility] INVASIVE CV LAB;  Service: Cardiovascular;  Laterality: Left;  . NO PAST SURGERIES      Family History  Problem Relation Age of Onset  . Hypertension Father   . Heart failure Father   . Hyperlipidemia Father     Social History   Tobacco Use  . Smoking status: Never Smoker  . Smokeless tobacco: Never Used  Substance Use Topics  . Alcohol use: No  . Drug use: No    No current facility-administered medications for this encounter.   Current Outpatient Medications:  .  aspirin EC 81 MG tablet, Take 81 mg by mouth daily., Disp: , Rfl:  .  atenolol (TENORMIN) 50 MG tablet, Take 100 mg by mouth daily. Takes 2 tablets, Disp: , Rfl:  .   Black Cohosh 540 MG CAPS, Take 540 mg by mouth daily., Disp: , Rfl:  .  carbamazepine (TEGRETOL) 200 MG tablet, TAKE 1 TABLET BY MOUTH ONCE  A DAY, Disp: , Rfl:  .  ferrous sulfate 325 (65 FE) MG tablet, Take 325 mg by mouth daily with breakfast., Disp: , Rfl:  .  isosorbide mononitrate (IMDUR) 60 MG 24 hr tablet, Take 60 mg by mouth daily., Disp: , Rfl:  .  Multiple Vitamins-Minerals (ALIVE WOMENS ENERGY) TABS, Take 1 tablet by mouth daily., Disp: , Rfl:  .  Omega-3 Fatty Acids (FISH OIL PO), Take 1 capsule by mouth daily., Disp: , Rfl:  .  omeprazole (PRILOSEC) 40 MG capsule, Take 40 mg by mouth daily., Disp: , Rfl:  .  simvastatin (ZOCOR) 40 MG tablet, Take 40 mg by mouth daily., Disp: , Rfl:  .  benzonatate (TESSALON) 100 MG capsule, Take 1 capsule (100 mg total) by mouth every 8 (eight) hours., Disp: 21 capsule, Rfl: 0 .  diphenhydrAMINE (BENADRYL) 25 mg capsule, Take 25 mg by mouth daily., Disp: , Rfl:  .  doxycycline (VIBRAMYCIN) 100 MG capsule, Take 1 capsule (100 mg total) by mouth 2 (two) times daily for 7 days., Disp: 14 capsule, Rfl: 0 .  Garlic 1000 MG CAPS, Take 1,000 mg by mouth daily., Disp: ,  Rfl:  .  Ginkgo Biloba Extract 60 MG CAPS, Take 60 mg by mouth daily., Disp: , Rfl:  .  ibuprofen (ADVIL) 600 MG tablet, Take 1 tablet (600 mg total) by mouth every 6 (six) hours as needed., Disp: 30 tablet, Rfl: 0 .  ipratropium (ATROVENT) 0.06 % nasal spray, Place 2 sprays into both nostrils 4 (four) times daily., Disp: 15 mL, Rfl: 1 .  nitroGLYCERIN (NITROSTAT) 0.4 MG SL tablet, PLACE 1 TABLET UNDER THE TONGUE EVERY 5 MINUTES AS NEEDED FOR CHEST PAIN MAY TAKE UP TO 3 DOSES., Disp: , Rfl: 1  Allergies  Allergen Reactions  . Penicillins Rash    Has patient had a PCN reaction causing immediate rash, facial/tongue/throat swelling, SOB or lightheadedness with hypotension: Yes Has patient had a PCN reaction causing severe rash involving mucus membranes or skin necrosis: Unknown Has patient  had a PCN reaction that required hospitalization: No Has patient had a PCN reaction occurring within the last 10 years: No If all of the above answers are "NO", then may proceed with Cephalosporin use.      ROS  As noted in HPI.   Physical Exam  BP (!) 158/92 (BP Location: Left Arm)   Pulse 86   Temp 98.9 F (37.2 C) (Oral)   Resp 16   Ht 5' (1.524 m)   Wt 68 kg   SpO2 99%   BMI 29.29 kg/m   Constitutional: Well developed, well nourished, no acute distress Eyes:  EOMI, conjunctiva normal bilaterally HENT: Normocephalic, atraumatic,mucus membranes moist Respiratory: Normal inspiratory effort Cardiovascular: Normal rate GI: nondistended Skin: 6 x 7 cm tender area of erythema, edema, swelling posterior neck.  Positive induration with a central eschar.  Some expressible purulent drainage.  Several scattered pustules.  Marked area of erythema with a marker for reference     Musculoskeletal: no deformities Neurologic: Alert & oriented x 3, no focal neuro deficits Psychiatric: Speech and behavior appropriate   ED Course   Medications - No data to display  Orders Placed This Encounter  Procedures  . Aerobic Culture (superficial specimen)    Standing Status:   Standing    Number of Occurrences:   1    No results found for this or any previous visit (from the past 24 hour(s)). No results found.  ED Clinical Impression  1. Abscess or cellulitis of scalp      ED Assessment/Plan   Patient with cellulitis of the scalp, possibly early abscess.  Procedure note: Cleaned area with chlorhexidine.  Used 0.5 cc of 1% lidocaine with epinephrine via local infiltration with adequate anesthesia.  Made a single stab mark with a sterile 11 blade.  Was able to express a small amount of pus and blood.  Wound culture sent.  Then irrigated with 120 cc of sterile saline.  Bacitracin, dressing placed.  Patient tolerated procedure well.  Home with doxycycline for 7 days,  ibuprofen/Tylenol 3-4 times a day as needed for pain.  Warm compresses.  Follow-up here or with her primary care physician in 2 to 3 days for wound check, she is to go to the ER for any worsening of her symptoms.  Discussed MDM, treatment plan, and plan for follow-up with patient. Discussed sn/sx that should prompt return to the ED. patient agrees with plan.   Meds ordered this encounter  Medications  . doxycycline (VIBRAMYCIN) 100 MG capsule    Sig: Take 1 capsule (100 mg total) by mouth 2 (two) times daily for 7 days.  Dispense:  14 capsule    Refill:  0  . ibuprofen (ADVIL) 600 MG tablet    Sig: Take 1 tablet (600 mg total) by mouth every 6 (six) hours as needed.    Dispense:  30 tablet    Refill:  0    *This clinic note was created using Scientist, clinical (histocompatibility and immunogenetics). Therefore, there may be occasional mistakes despite careful proofreading.   ?    Domenick Gong, MD 10/30/18 1640

## 2018-10-30 NOTE — Discharge Instructions (Addendum)
Return here or follow up with your doctor in 2-3 days for a wound check. Give [place] a working phone number so that we contact you if we need to change your antibiotics. Take the medication as written. Take 1 gram of tylenol with the motrin up to 4 times a day as needed for pain . Return to the ER if you get worse, have a fever >100.4, or for any concerns.   Go to www.goodrx.com to look up your medications. This will give you a list of where you can find your prescriptions at the most affordable prices. Or ask the pharmacist what the cash price is, or if they have any other discount programs available to help make your medication more affordable. This can be less expensive than what you would pay with insurance.

## 2018-11-02 ENCOUNTER — Telehealth (HOSPITAL_COMMUNITY): Payer: Self-pay | Admitting: Emergency Medicine

## 2018-11-02 LAB — AEROBIC CULTURE W GRAM STAIN (SUPERFICIAL SPECIMEN)

## 2018-11-02 NOTE — Telephone Encounter (Signed)
Culture shows MRSA. Treated with Doxy. Attempted to reach patient to see how she was doing, no answer.

## 2019-01-04 ENCOUNTER — Encounter: Payer: Self-pay | Admitting: Emergency Medicine

## 2019-01-04 ENCOUNTER — Ambulatory Visit
Admission: EM | Admit: 2019-01-04 | Discharge: 2019-01-04 | Disposition: A | Discharge status: Home / Self Care | Payer: 59 | Attending: Family | Admitting: Family

## 2019-01-04 ENCOUNTER — Other Ambulatory Visit: Payer: Self-pay

## 2019-01-04 DIAGNOSIS — L02411 Cutaneous abscess of right axilla: Secondary | ICD-10-CM

## 2019-01-04 DIAGNOSIS — Z8614 Personal history of Methicillin resistant Staphylococcus aureus infection: Secondary | ICD-10-CM

## 2019-01-04 MED ORDER — SULFAMETHOXAZOLE-TRIMETHOPRIM 800-160 MG PO TABS: 1.0000 | ORAL_TABLET | Freq: Two times a day (BID) | ORAL | 0 refills | Status: AC

## 2019-01-04 NOTE — ED Triage Notes (Signed)
Pt c/o abscess in her right axilla. Started about 3 days ago. She states it is pain but has not had any drainage.

## 2019-01-04 NOTE — ED Provider Notes (Signed)
[facility]    CSN: [id number] Arrival date & time: 01/04/19  0850      History   Chief Complaint Chief Complaint  Patient presents with  . Abscess    right axilla    HPI Samantha Watson is a 48 y.o. female.   48 year old female presents with swelling and pain under her right axilla that she noticed about 3 to 4 days ago. Uncertain if she is developing another abscess. Does shave under her arms. Area has become more painful but no drainage. No fever. Has not applied any warm compresses or topical treatments. Has history of recurrent abscesses- was last seen here in Sept 2020 with an abscess on the back of her neck. Was placed on Doxycycline and Wound culture grew out MRSA but did slowly resolve with I & D and medication. Had a previous abscess on her right wrist/arm that developed into cellulitis and had to have IV Clindamycin and I & D at [facility] in May 2020. No known history of Diabetes. Other chronic health issues include HTN, CAD (hx of MI), and seizures and currently on Atenolol, Imdur, Zocor, Tegretol, Prilosec, aspirin, Fe supplement and various other supplements daily.   The history is provided by the patient.    Past Medical History:  Diagnosis Date  . Hypertension   . MI (myocardial infarction) (HCC) 2016  . Seizures (HCC)     There are no active problems to display for this patient.   Past Surgical History:  Procedure Laterality Date  . CARDIAC CATHETERIZATION  2016   possible   . LEFT HEART CATH AND CORONARY ANGIOGRAPHY Left 04/07/2017   Procedure: LEFT HEART CATH AND CORONARY ANGIOGRAPHY;  Surgeon: Marcina MillardParaschos, Alexander, MD;  Location: [facility] INVASIVE CV LAB;  Service: Cardiovascular;  Laterality: Left;  . NO PAST SURGERIES      OB History   No obstetric history on file.      Home Medications    Prior to Admission medications   Medication Sig Start Date End Date Taking? Authorizing Provider  aspirin EC 81 MG tablet Take 81 mg by mouth daily.    Yes [provider]  atenolol (TENORMIN) 50 MG tablet Take 100 mg by mouth daily. Takes 2 tablets   Yes [provider]  Black Cohosh 540 MG CAPS Take 540 mg by mouth daily.   Yes [provider]  carbamazepine (TEGRETOL) 200 MG tablet TAKE 1 TABLET BY MOUTH ONCE  A DAY 11/24/12  Yes [provider]  diphenhydrAMINE (BENADRYL) 25 mg capsule Take 25 mg by mouth daily.   Yes [provider]  ferrous sulfate 325 (65 FE) MG tablet Take 325 mg by mouth daily with breakfast.   Yes [provider]  Garlic 1000 MG CAPS Take 1,000 mg by mouth daily.   Yes [provider]  Ginkgo Biloba Extract 60 MG CAPS Take 60 mg by mouth daily.   Yes [provider]  ibuprofen (ADVIL) 600 MG tablet Take 1 tablet (600 mg total) by mouth every 6 (six) hours as needed. 10/30/18  Yes Domenick GongMortenson, Ashley, MD  ipratropium (ATROVENT) 0.06 % nasal spray Place 2 sprays into both nostrils 4 (four) times daily. 08/12/17  Yes Cook, Jayce G, DO  isosorbide mononitrate (IMDUR) 60 MG 24 hr tablet Take 60 mg by mouth daily.   Yes [provider]  Multiple Vitamins-Minerals (ALIVE WOMENS ENERGY) TABS Take 1 tablet by mouth daily.   Yes [provider]  nitroGLYCERIN (  NITROSTAT) 0.4 MG SL tablet PLACE 1 TABLET UNDER THE TONGUE EVERY 5 MINUTES AS NEEDED FOR CHEST PAIN MAY TAKE UP TO 3 DOSES. 03/29/17  Yes [provider]  Omega-3 Fatty Acids (FISH OIL PO) Take 1 capsule by mouth daily.   Yes [provider]  omeprazole (PRILOSEC) 40 MG capsule Take 40 mg by mouth daily.   Yes [provider]  simvastatin (ZOCOR) 40 MG tablet Take 40 mg by mouth daily.   Yes [provider]  sulfamethoxazole-trimethoprim (BACTRIM DS) 800-160 MG tablet Take 1 tablet by mouth 2 (two) times daily for 14 days. 01/04/19 01/18/19  Samantha Grumbling, NP    Family History Family History  Problem Relation Age of Onset  . Hypertension Father   .  Heart failure Father   . Hyperlipidemia Father     Social History Social History   Tobacco Use  . Smoking status: Never Smoker  . Smokeless tobacco: Never Used  Substance Use Topics  . Alcohol use: No  . Drug use: No     Allergies   Penicillins   Review of Systems Review of Systems  Constitutional: Negative for activity change, appetite change, chills, diaphoresis, fatigue and fever.  HENT: Negative for facial swelling, mouth sores, sore throat and trouble swallowing.   Respiratory: Negative for cough, chest tightness, shortness of breath and wheezing.   Gastrointestinal: Negative for nausea and vomiting.  Musculoskeletal: Negative for arthralgias, myalgias, neck pain and neck stiffness.  Skin: Positive for color change. Negative for rash and wound.  Allergic/Immunologic: Positive for environmental allergies. Negative for food allergies and immunocompromised state.  Neurological: Positive for seizures (history- controlled with medication). Negative for dizziness, tremors, syncope, weakness, light-headedness, numbness and headaches.  Hematological: Negative for adenopathy. Bruises/bleeds easily.     Physical Exam Triage Vital Signs ED Triage Vitals  Enc Vitals Group     BP 01/04/19 0903 134/87     Pulse Rate 01/04/19 0903 67     Resp 01/04/19 0903 18     Temp 01/04/19 0903 98.7 F (37.1 C)     Temp Source 01/04/19 0903 Oral     SpO2 01/04/19 0903 100 %     Weight 01/04/19 0900 150 lb (68 kg)     Height 01/04/19 0900 5' (1.524 m)     Head Circumference --      Peak Flow --      Pain Score 01/04/19 0859 7     Pain Loc --      Pain Edu? --      Excl. in [place]? --    No data found.  Updated Vital Signs BP 134/87 (BP Location: Left Arm)   Pulse 67   Temp 98.7 F (37.1 C) (Oral)   Resp 18   Ht 5' (1.524 m)   Wt 150 lb (68 kg)   SpO2 100%   BMI 29.29 kg/m   Visual Acuity Right Eye Distance:   Left Eye Distance:   Bilateral Distance:    Right Eye Near:    Left Eye Near:    Bilateral Near:     Physical Exam Vitals signs and nursing note reviewed.  Constitutional:      General: She is awake. She is not in acute distress.    Appearance: She is well-developed, well-groomed and overweight. She is not ill-appearing.     Comments: Patient sitting comfortably on exam table in no acute distress but appears in pain when moving right arm.   HENT:  Head: Normocephalic and atraumatic.     Right Ear: External ear normal.     Left Ear: External ear normal.  Eyes:     Extraocular Movements: Extraocular movements intact.     Conjunctiva/sclera: Conjunctivae normal.  Neck:     Musculoskeletal: Normal range of motion and neck supple.      Comments: Previous abscess on back of neck has healed with some scarring.  Cardiovascular:     Rate and Rhythm: Normal rate.  Pulmonary:     Effort: Pulmonary effort is normal.  Chest:       Comments: 3cm by 2cm area of erythema under right axilla. A few red, raised satellite lesions present. Very tender. Area is hard with minimal fluctuance. No drainage or fluid seen with compression of skin. Slight swelling of adjacent lymph node. See photo for more details.  Musculoskeletal: Normal range of motion.  Lymphadenopathy:     Upper Body:     Right upper body: Axillary adenopathy present. No supraclavicular adenopathy.     Left upper body: No supraclavicular or axillary adenopathy.  Skin:    General: Skin is warm.     Findings: Abscess and erythema present. No bruising, ecchymosis or petechiae.     Comments: Lesion behind neck has healed. Another small lesion present on right side of neck just behind ear. Additional lesion on right wrist is still slightly red and raised. No lesions seen under left axilla.   Neurological:     Mental Status: She is alert.  Psychiatric:        Behavior: Behavior is cooperative.        UC Treatments / Results  Labs (all labs ordered are listed, but only abnormal results are  displayed) Labs Reviewed - No data to display  EKG   Radiology No results found.  Procedures Procedures (including critical care time)  Medications Ordered in UC Medications - No data to display  Initial Impression / Assessment and Plan / UC Course  I have reviewed the triage vital signs and the nursing notes.  Pertinent labs & imaging results that were available during my care of the patient were reviewed by me and considered in my medical decision making (see chart for details).    Reviewed with patient that she appears to have another skin abscess under her arm in addition to mild lymph node swelling. Discussed that area is very hard with minimal fluctuance- reviewed possible I & D today but doubt much drainage would occur yet. Since patient has not applied any warm compresses and tried any treatment, recommend use warm compresses to area at least 4 times a day. Due to history of MRSA, recommend start Bactrim DS twice a day as directed for at least 2 weeks. May take Ibuprofen 600mg  every 6 hours as needed for pain. May alternate with Tylenol 1000mg  every 8 hours as needed. Discussed further evaluation by a dermatologist since skin lesions are recurring and appearing more frequently. Recommend call Dr. Ebony Cargo today to schedule appointment. May follow-up here in 2 to 3 days for possible I & D of abscess if not improving.   Final Clinical Impressions(s) / UC Diagnoses   Final diagnoses:  Abscess of axilla, right  History of MRSA infection     Discharge Instructions     Recommend start Bactrim twice a day as directed. Apply warm compresses to area at least 4 times a day to help with comfort and to help soften abscess to encourage drainage. May take Ibuprofen 600mg   every 6 hours as needed for pain. May alternate with Tylenol 1000mg  every 8 hours as needed. Call Dr. Mercy Riding, Dermatologist today to schedule appointment for evaluation of recurrent abscesses. Also follow-up  here in 2 to 3 days for incision and drainage if not improving.     ED Prescriptions    Medication Sig Dispense Auth. Provider   sulfamethoxazole-trimethoprim (BACTRIM DS) 800-160 MG tablet Take 1 tablet by mouth 2 (two) times daily for 14 days. 28 tablet Adwoa Axe, Nicholes Stairs, NP     PDMP not reviewed this encounter.   Katy Apo, NP 01/04/19 2219

## 2019-01-04 NOTE — Discharge Instructions (Addendum)
Recommend start Bactrim twice a day as directed. Apply warm compresses to area at least 4 times a day to help with comfort and to help soften abscess to encourage drainage. May take Ibuprofen 600mg  every 6 hours as needed for pain. May alternate with Tylenol 1000mg  every 8 hours as needed. Call Dr. Mercy Riding, Dermatologist today to schedule appointment for evaluation of recurrent abscesses. Also follow-up here in 2 to 3 days for incision and drainage if not improving.

## 2019-02-07 ENCOUNTER — Encounter: Payer: Self-pay | Admitting: Emergency Medicine

## 2019-02-07 ENCOUNTER — Ambulatory Visit
Admission: EM | Admit: 2019-02-07 | Discharge: 2019-02-07 | Disposition: A | Discharge status: Home / Self Care | Payer: 59 | Attending: Emergency Medicine | Admitting: Emergency Medicine

## 2019-02-07 ENCOUNTER — Other Ambulatory Visit: Payer: Self-pay

## 2019-02-07 DIAGNOSIS — L03221 Cellulitis of neck: Secondary | ICD-10-CM | POA: Diagnosis not present

## 2019-02-07 DIAGNOSIS — L0390 Cellulitis, unspecified: Secondary | ICD-10-CM

## 2019-02-07 HISTORY — DX: Hyperlipidemia, unspecified: E78.5

## 2019-02-07 MED ORDER — MUPIROCIN 2 % EX OINT: TOPICAL_OINTMENT | CUTANEOUS | 1 refills | Status: AC

## 2019-02-07 MED ORDER — DOXYCYCLINE HYCLATE 100 MG PO CAPS: 100.0000 mg | ORAL_CAPSULE | Freq: Two times a day (BID) | ORAL | 0 refills | Status: DC

## 2019-02-07 NOTE — Discharge Instructions (Addendum)
Take medication as prescribed. Rest. Drink plenty of fluids. Frequent warm compresses.   Follow up with your primary care physician this week as needed. Return to Urgent care for new or worsening concerns.

## 2019-02-07 NOTE — ED Triage Notes (Signed)
Patient in today c/o neck abscess x 2-3 weeks. Patient states this is the 3rd one she has had.

## 2019-02-07 NOTE — ED Provider Notes (Signed)
[facility] ____________________________________________  Time seen: Approximately 9:29 AM  I have reviewed the triage vital signs and the nursing notes.   HISTORY  Chief Complaint Abscess (neck)   HPI Samantha Watson is a 48 y.o. female past medical history of recurrent cellulitis and abscesses with reported history of MRSA presenting for evaluation of right neck abscess.  Reports this has been gradual in onset in the last 2 weeks.  States in the last few days has become more red and tender.  States she tried to drain it with a clean 10 without any drainage and concerned that it does not have anything to drain yet.  Denies fevers.  Continues eat and drink well.  Denies chest pain, shortness of breath.  Last antibiotic was Bactrim approximately 1 month ago.  Denies known trigger.  Reports otherwise doing well.  Etheleen Nicks, NP : PCP   Past Medical History:  Diagnosis Date  . Hyperlipidemia   . Hypertension   . MI (myocardial infarction) (HCC) 2016  . Seizures (HCC)     There are no problems to display for this patient.   Past Surgical History:  Procedure Laterality Date  . CARDIAC CATHETERIZATION  2016   possible   . LEFT HEART CATH AND CORONARY ANGIOGRAPHY Left 04/07/2017   Procedure: LEFT HEART CATH AND CORONARY ANGIOGRAPHY;  Surgeon: Marcina Millard, MD;  Location: [facility] INVASIVE CV LAB;  Service: Cardiovascular;  Laterality: Left;     No current facility-administered medications for this encounter.  Current Outpatient Medications:  .  aspirin EC 81 MG tablet, Take 81 mg by mouth daily., Disp: , Rfl:  .  atenolol (TENORMIN) 50 MG tablet, Take 100 mg by mouth daily. Takes 2 tablets, Disp: , Rfl:  .  Black Cohosh 540 MG CAPS, Take 540 mg by mouth daily., Disp: , Rfl:  .  carbamazepine (TEGRETOL) 200 MG tablet, TAKE 1 TABLET BY MOUTH ONCE  A DAY, Disp: , Rfl:  .  diphenhydrAMINE (BENADRYL) 25 mg capsule, Take 25 mg by mouth daily., Disp: , Rfl:  .   ferrous sulfate 325 (65 FE) MG tablet, Take 325 mg by mouth daily with breakfast., Disp: , Rfl:  .  Garlic 1000 MG CAPS, Take 1,000 mg by mouth daily., Disp: , Rfl:  .  Ginkgo Biloba Extract 60 MG CAPS, Take 60 mg by mouth daily., Disp: , Rfl:  .  ibuprofen (ADVIL) 600 MG tablet, Take 1 tablet (600 mg total) by mouth every 6 (six) hours as needed., Disp: 30 tablet, Rfl: 0 .  ipratropium (ATROVENT) 0.06 % nasal spray, Place 2 sprays into both nostrils 4 (four) times daily., Disp: 15 mL, Rfl: 1 .  isosorbide mononitrate (IMDUR) 60 MG 24 hr tablet, Take 60 mg by mouth daily., Disp: , Rfl:  .  Multiple Vitamins-Minerals (ALIVE WOMENS ENERGY) TABS, Take 1 tablet by mouth daily., Disp: , Rfl:  .  nitroGLYCERIN (NITROSTAT) 0.4 MG SL tablet, PLACE 1 TABLET UNDER THE TONGUE EVERY 5 MINUTES AS NEEDED FOR CHEST PAIN MAY TAKE UP TO 3 DOSES., Disp: , Rfl: 1 .  Omega-3 Fatty Acids (FISH OIL PO), Take 1 capsule by mouth daily., Disp: , Rfl:  .  omeprazole (PRILOSEC) 40 MG capsule, Take 40 mg by mouth daily., Disp: , Rfl:  .  simvastatin (ZOCOR) 40 MG tablet, Take 40 mg by mouth daily., Disp: , Rfl:  .  doxycycline (VIBRAMYCIN) 100 MG capsule, Take 1 capsule (100 mg total) by mouth 2 (two) times daily., Disp:  20 capsule, Rfl: 0 .  mupirocin ointment (BACTROBAN) 2 %, Apply two times a day for 7 days., Disp: 22 g, Rfl: 1  Allergies Penicillins  Family History  Problem Relation Age of Onset  . Breast cancer Mother   . Hypertension Father   . Heart failure Father   . Hyperlipidemia Father     Social History Social History   Tobacco Use  . Smoking status: Never Smoker  . Smokeless tobacco: Never Used  Substance Use Topics  . Alcohol use: No  . Drug use: No    Review of Systems Constitutional: No fever ENT: No sore throat. Cardiovascular: Denies chest pain. Respiratory: Denies shortness of breath. Gastrointestinal: No abdominal pain.  No nausea, no vomiting.  No diarrhea.   Musculoskeletal:  Negative for back pain. Skin: As above.    ____________________________________________   PHYSICAL EXAM:  VITAL SIGNS: ED Triage Vitals  Enc Vitals Group     BP 02/07/19 0913 (!) 156/94     Pulse Rate 02/07/19 0913 73     Resp 02/07/19 0913 16     Temp 02/07/19 0913 97.6 F (36.4 C)     Temp Source 02/07/19 0913 Oral     SpO2 02/07/19 0913 100 %     Weight 02/07/19 0914 150 lb (68 kg)     Height 02/07/19 0914 5' (1.524 m)     Head Circumference --      Peak Flow --      Pain Score 02/07/19 0913 8     Pain Loc --      Pain Edu? --      Excl. in [place]? --     Constitutional: Alert and oriented. Well appearing and in no acute distress. Eyes: Conjunctivae are normal.  ENT      Head: Normocephalic and atraumatic. Cardiovascular: Normal rate heart rate.  Respiratory: Normal respiratory effort without tachypnea nor retractions.  Musculoskeletal: Steady gait.  Neurologic:  Normal speech and language. Speech is normal. No gait instability.  Skin:  Skin is warm, dry. Except: Right posterior neck approximately 3 x 3 cm area of erythema with induration, no fluctuance, no drainage, mild tenderness, no further surrounding induration tenderness or swelling. Psychiatric: Mood and affect are normal. Speech and behavior are normal. Patient exhibits appropriate insight and judgment   ___________________________________________   LABS (all labs ordered are listed, but only abnormal results are displayed)  Labs Reviewed - No data to display  PROCEDURES Procedures  _______________________________________   INITIAL IMPRESSION / ASSESSMENT AND PLAN / ED COURSE  Pertinent labs & imaging results that were available during my care of the patient were reviewed by me and considered in my medical decision making (see chart for details).   Well-appearing patient.  No acute distress.  Recurrent cellulitis and abscess.  Right posterior neck cellulitis developing abscess, no fluctuance, no I&D  indicated at this time.  Will treat with oral doxycycline and topical Bactroban.  Refill given for Bactroban and encouraged to start utilizing at the very beginning of these.  Follow-up with primary. Discussed indication, risks and benefits of medications with patient.  Discussed follow up and return parameters including no resolution or any worsening concerns. Patient verbalized understanding and agreed to plan.   ____________________________________________   FINAL CLINICAL IMPRESSION(S) / ED DIAGNOSES  Final diagnoses:  Cellulitis, unspecified cellulitis site     ED Discharge Orders         Ordered    doxycycline (VIBRAMYCIN) 100 MG capsule  2 times  daily     02/07/19 0931    mupirocin ointment (BACTROBAN) 2 %     02/07/19 0931           Note: This dictation was prepared with Dragon dictation along with smaller phrase technology. Any transcriptional errors that result from this process are unintentional.         Renford Dills, NP 02/07/19 0945

## 2019-02-23 ENCOUNTER — Ambulatory Visit (HOSPITAL_COMMUNITY): Admission: EM | Admit: 2019-02-23 | Discharge: 2019-02-23 | Discharge status: Absent without leave | Payer: 59

## 2020-04-26 ENCOUNTER — Encounter: Payer: Self-pay | Admitting: Emergency Medicine

## 2020-04-26 ENCOUNTER — Other Ambulatory Visit: Payer: Self-pay

## 2020-04-26 ENCOUNTER — Ambulatory Visit
Admission: EM | Admit: 2020-04-26 | Discharge: 2020-04-26 | Disposition: A | Discharge status: Home / Self Care | Payer: Medicare Other | Attending: Emergency Medicine | Admitting: Emergency Medicine

## 2020-04-26 DIAGNOSIS — M791 Myalgia, unspecified site: Secondary | ICD-10-CM | POA: Insufficient documentation

## 2020-04-26 DIAGNOSIS — Z880 Allergy status to penicillin: Secondary | ICD-10-CM | POA: Insufficient documentation

## 2020-04-26 DIAGNOSIS — R5383 Other fatigue: Secondary | ICD-10-CM | POA: Insufficient documentation

## 2020-04-26 DIAGNOSIS — Z79899 Other long term (current) drug therapy: Secondary | ICD-10-CM | POA: Insufficient documentation

## 2020-04-26 DIAGNOSIS — M2550 Pain in unspecified joint: Secondary | ICD-10-CM | POA: Diagnosis not present

## 2020-04-26 DIAGNOSIS — Z7982 Long term (current) use of aspirin: Secondary | ICD-10-CM | POA: Diagnosis not present

## 2020-04-26 LAB — COMPREHENSIVE METABOLIC PANEL
ALT: 38 U/L (ref 0–44)
AST: 32 U/L (ref 15–41)
Albumin: 4.3 g/dL (ref 3.5–5.0)
Alkaline Phosphatase: 59 U/L (ref 38–126)
Anion gap: 8 (ref 5–15)
BUN: 15 mg/dL (ref 6–20)
CO2: 29 mmol/L (ref 22–32)
Calcium: 8.9 mg/dL (ref 8.9–10.3)
Chloride: 102 mmol/L (ref 98–111)
Creatinine, Ser: 0.53 mg/dL (ref 0.44–1.00)
GFR, Estimated: >60 mL/min (ref >60)
Glucose, Bld: 118 mg/dL — ABNORMAL HIGH (ref 70–99)
Potassium: 3.8 mmol/L (ref 3.5–5.1)
Sodium: 139 mmol/L (ref 135–145)
Total Bilirubin: 0.5 mg/dL (ref 0.3–1.2)
Total Protein: 7.6 g/dL (ref 6.5–8.1)

## 2020-04-26 LAB — CBC WITH DIFFERENTIAL/PLATELET
Abs Immature Granulocytes: 0.02 10*3/uL (ref 0.00–0.07)
Basophils Absolute: 0 10*3/uL (ref 0.0–0.1)
Basophils Relative: 1 %
Eosinophils Absolute: 0.1 10*3/uL (ref 0.0–0.5)
Eosinophils Relative: 1 %
HCT: 39.2 % (ref 36.0–46.0)
Hemoglobin: 13.5 g/dL (ref 12.0–15.0)
Immature Granulocytes: 0 %
Lymphocytes Relative: 32 %
Lymphs Abs: 2.4 10*3/uL (ref 0.7–4.0)
MCH: 30.5 pg (ref 26.0–34.0)
MCHC: 34.4 g/dL (ref 30.0–36.0)
MCV: 88.5 fL (ref 80.0–100.0)
Monocytes Absolute: 0.7 10*3/uL (ref 0.1–1.0)
Monocytes Relative: 9 %
Neutro Abs: 4.3 10*3/uL (ref 1.7–7.7)
Neutrophils Relative %: 57 %
Platelets: 336 10*3/uL (ref 150–400)
RBC: 4.43 MIL/uL (ref 3.87–5.11)
RDW: 11.8 % (ref 11.5–15.5)
WBC: 7.6 10*3/uL (ref 4.0–10.5)
nRBC: 0 % (ref 0.0–0.2)

## 2020-04-26 LAB — URINALYSIS, COMPLETE (UACMP) WITH MICROSCOPIC
Bilirubin Urine: NEGATIVE
Glucose, UA: NEGATIVE mg/dL
Hgb urine dipstick: NEGATIVE
Ketones, ur: NEGATIVE mg/dL
Leukocytes,Ua: NEGATIVE
Nitrite: NEGATIVE
Protein, ur: NEGATIVE mg/dL
RBC / HPF: NONE SEEN RBC/hpf (ref 0–5)
Specific Gravity, Urine: 1.015 (ref 1.005–1.030)
pH: 6.5 (ref 5.0–8.0)

## 2020-04-26 LAB — SEDIMENTATION RATE: Sed Rate: 18 mm/hr (ref 0–20)

## 2020-04-26 LAB — CK: Total CK: 112 U/L (ref 38–234)

## 2020-04-26 NOTE — ED Provider Notes (Signed)
[facility]    CSN: [id number] Arrival date & time: 04/26/20  1201      History   Chief Complaint Chief Complaint  Patient presents with  . Generalized Body Aches  . Leg Pain    HPI Samantha Watson is a 50 y.o. female.   HPI   50 year old female here for evaluation of body aches.  Patient reports that she has been experiencing generalized body aches for the last week but they are more prominent in her thighs.  She also has been experiencing joint pain and fatigue.  Patient reports that the pain increases when she uses her muscles.  Patient denies headache, chest pain, shortness of breath, or urine changes.    Past Medical History:  Diagnosis Date  . Hyperlipidemia   . Hypertension   . MI (myocardial infarction) ([place]) 2016  . Seizures ([place])     There are no problems to display for this patient.   Past Surgical History:  Procedure Laterality Date  . CARDIAC CATHETERIZATION  2016   possible   . LEFT HEART CATH AND CORONARY ANGIOGRAPHY Left 04/07/2017   Procedure: LEFT HEART CATH AND CORONARY ANGIOGRAPHY;  Surgeon: Isaias Cowman, MD;  Location: [facility];  Service: Cardiovascular;  Laterality: Left;    OB History   No obstetric history on file.      Home Medications    Prior to Admission medications   Medication Sig Start Date End Date Taking? Authorizing Provider  amLODipine (NORVASC) 5 MG tablet Take 1 tablet by mouth daily. 11/29/18  Yes [provider]  furosemide (LASIX) 20 MG tablet Take by mouth. 09/26/19 09/25/20 Yes [provider]  aspirin EC 81 MG tablet Take 81 mg by mouth daily.    [provider]  atenolol (TENORMIN) 50 MG tablet Take 100 mg by mouth daily. Takes 2 tablets    [provider]  Black Cohosh 540 MG CAPS Take 540 mg by mouth daily.    [provider]  carbamazepine (TEGRETOL) 200 MG tablet TAKE 1 TABLET BY MOUTH ONCE  A DAY 11/24/12   [provider]   diphenhydrAMINE (BENADRYL) 25 mg capsule Take 25 mg by mouth daily.    [provider]  ferrous sulfate 325 (65 FE) MG tablet Take 325 mg by mouth daily with breakfast.    [provider]  Garlic 9326 MG CAPS Take 1,000 mg by mouth daily.    [provider]  Ginkgo Biloba Extract 60 MG CAPS Take 60 mg by mouth daily.    [provider]  ibuprofen (ADVIL) 600 MG tablet Take 1 tablet (600 mg total) by mouth every 6 (six) hours as needed. 10/30/18   Melynda Ripple, MD  ipratropium (ATROVENT) 0.06 % nasal spray Place 2 sprays into both nostrils 4 (four) times daily. 08/12/17   Coral Spikes, DO  isosorbide mononitrate (IMDUR) 60 MG 24 hr tablet Take 60 mg by mouth daily.    [provider]  Multiple Vitamins-Minerals (ALIVE WOMENS ENERGY) TABS Take 1 tablet by mouth daily.    [provider]  mupirocin ointment (BACTROBAN) 2 % Apply two times a day for 7 days. 02/07/19   Marylene Land, NP  nitroGLYCERIN (NITROSTAT) 0.4 MG SL tablet PLACE 1 TABLET UNDER THE TONGUE EVERY 5 MINUTES AS NEEDED FOR CHEST PAIN MAY TAKE UP TO 3 DOSES. 03/29/17   [provider]  Omega-3 Fatty Acids (FISH OIL PO) Take 1 capsule by mouth daily.  [provider]  omeprazole (PRILOSEC) 40 MG capsule Take 40 mg by mouth daily.    [provider]  simvastatin (ZOCOR) 40 MG tablet Take 40 mg by mouth daily.    [provider]    Family History Family History  Problem Relation Age of Onset  . Breast cancer Mother   . Hypertension Father   . Heart failure Father   . Hyperlipidemia Father     Social History Social History   Tobacco Use  . Smoking status: Never Smoker  . Smokeless tobacco: Never Used  Vaping Use  . Vaping Use: Never used  Substance Use Topics  . Alcohol use: No  . Drug use: No     Allergies   Penicillins   Review of Systems Review of Systems  Constitutional: Positive for fatigue. Negative for activity  change, appetite change and fever.  Respiratory: Negative for shortness of breath.   Cardiovascular: Negative for chest pain.  Genitourinary: Negative for hematuria.  Musculoskeletal: Positive for arthralgias and myalgias.  Skin: Negative for rash.  Neurological: Negative for weakness.  Hematological: Negative.   Psychiatric/Behavioral: Negative.      Physical Exam Triage Vital Signs ED Triage Vitals  Enc Vitals Group     BP 04/26/20 1217 (!) 148/129     Pulse Rate 04/26/20 1217 68     Resp 04/26/20 1217 17     Temp 04/26/20 1217 97.8 F (36.6 C)     Temp Source 04/26/20 1217 Oral     SpO2 04/26/20 1217 100 %     Weight --      Height --      Head Circumference --      Peak Flow --      Pain Score 04/26/20 1215 8     Pain Loc --      Pain Edu? --      Excl. in [place]? --    No data found.  Updated Vital Signs BP (!) 132/91 (BP Location: Left Arm)   Pulse 68   Temp 97.8 F (36.6 C) (Oral)   Resp 17   SpO2 100%   Visual Acuity Right Eye Distance:   Left Eye Distance:   Bilateral Distance:    Right Eye Near:   Left Eye Near:    Bilateral Near:     Physical Exam Vitals and nursing note reviewed.  Constitutional:      General: She is not in acute distress.    Appearance: Normal appearance. She is not ill-appearing.  HENT:     Head: Normocephalic and atraumatic.  Cardiovascular:     Rate and Rhythm: Normal rate and regular rhythm.     Pulses: Normal pulses.     Heart sounds: Normal heart sounds. No murmur heard. No gallop.   Pulmonary:     Effort: Pulmonary effort is normal.     Breath sounds: Normal breath sounds. No wheezing, rhonchi or rales.  Musculoskeletal:        General: Tenderness present. No swelling or signs of injury. Normal range of motion.  Skin:    General: Skin is warm and dry.     Capillary Refill: Capillary refill takes less than 2 seconds.     Findings: No erythema or rash.  Neurological:     General: No focal deficit present.      Mental Status: She is alert and oriented to person, place, and time.  Psychiatric:        Mood and Affect: Mood normal.  Behavior: Behavior normal.        Thought Content: Thought content normal.        Judgment: Judgment normal.      UC Treatments / Results  Labs (all labs ordered are listed, but only abnormal results are displayed) Labs Reviewed  COMPREHENSIVE METABOLIC PANEL - Abnormal; Notable for the following components:      Result Value   Glucose, Bld 118 (*)    All other components within normal limits  URINALYSIS, COMPLETE (UACMP) WITH MICROSCOPIC - Abnormal; Notable for the following components:   Bacteria, UA MANY (*)    All other components within normal limits  URINE CULTURE  CBC WITH DIFFERENTIAL/PLATELET  SEDIMENTATION RATE  CK    EKG   Radiology No results found.  Procedures Procedures (including critical care time)  Medications Ordered in UC Medications - No data to display  Initial Impression / Assessment and Plan / UC Course  I have reviewed the triage vital signs and the nursing notes.  Pertinent labs & imaging results that were available during my care of the patient were reviewed by me and considered in my medical decision making (see chart for details).   Patient is here for evaluation of generalized body aches that have been going on for the last week.  She had associated symptoms of joint pain and fatigue.  She says it hurts more when she uses her muscles.  Patient has a history of ischemic heart disease and MI and she is taking 40 mg of simvastatin daily.  She reports that she has not had any changes to her dosing.  She has never been told that you can have muscle pain as a result of statins either.  Patient denies any blood or pink tinge to her urine.  Patient has no fever or other cold symptoms that would explain the body aches.  Suspect patient may have myopathy secondary to statin use.  Will check CBC, CMP, CK, ESR, and urine.  CMP is  unremarkable.  CK is 112.  Urinalysis says many bacteria but there is no nitrites or leukocyte esterase.  No protein.  Will send urine for culture.  CBC is unremarkable.  Sedimentation rate 18.  Patient's lab work is unremarkable.  I suspect that her body aches may be coming from her statin.  Will advise patient to increase her oral fluid intake to help flush her system and talk with her cardiologist about possibly switching her statin or adjusting her dose.     Final Clinical Impressions(s) / UC Diagnoses   Final diagnoses:  Myalgia     Discharge Instructions     Your muscle pain may be coming from your simvastatin use as it can cause muscle inflammation and pain in the joints and muscle groups.  Take over-the-counter ibuprofen according to the package instructions as needed for pain.  Increase your oral fluid intake so that you improve your hydration and this may help your muscle pain as well.  Discussed with your cardiologist about either adjusting the dose of your simvastatin or trying a different statin altogether.    ED Prescriptions    None     PDMP not reviewed this encounter.   Margarette Canada, NP 04/26/20 1421

## 2020-04-26 NOTE — ED Triage Notes (Signed)
Pt states that she is having body aches but it intensifies in her legs. Pt also feels tingling throughout her body. Pt states that she may have pulled a muscle doing garden work but denies any injury.  Pt states that her pain started a week ago.

## 2020-04-26 NOTE — Discharge Instructions (Addendum)
Your muscle pain may be coming from your simvastatin use as it can cause muscle inflammation and pain in the joints and muscle groups.  Take over-the-counter ibuprofen according to the package instructions as needed for pain.  Increase your oral fluid intake so that you improve your hydration and this may help your muscle pain as well.  Discussed with your cardiologist about either adjusting the dose of your simvastatin or trying a different statin altogether.

## 2020-04-28 LAB — URINE CULTURE: Special Requests: NORMAL

## 2020-06-14 IMAGING — US RIGHT UPPER EXTREMITY SOFT TISSUE ULTRASOUND LIMITED
1 series · 10 of 10 positions shown · non-contrast
Comparison: None

CLINICAL DATA: Forearm pain.

EXAM:
ULTRASOUND right UPPER EXTREMITY LIMITED
TECHNIQUE: Ultrasound examination of the upper extremity soft tissues was
performed in the area of clinical concern.

[Series 1: right upper extremity soft tissue ultrasound limit · 10 acquisitions, 10 frames shown]
[im 1/10]
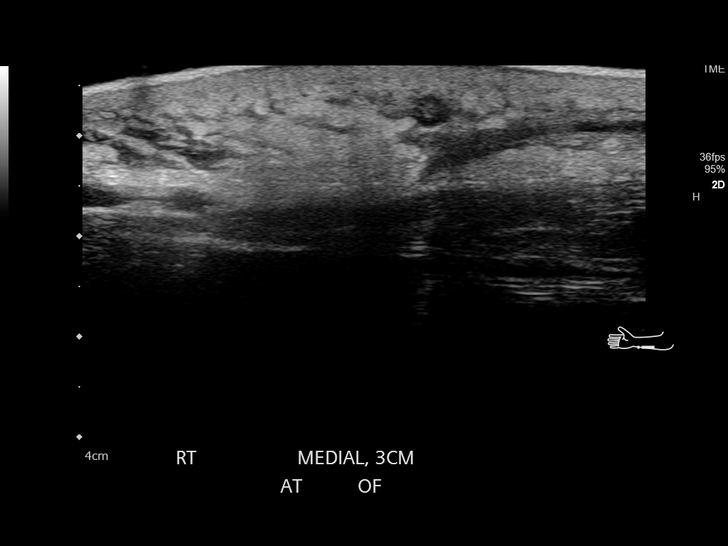
[im 2/10]
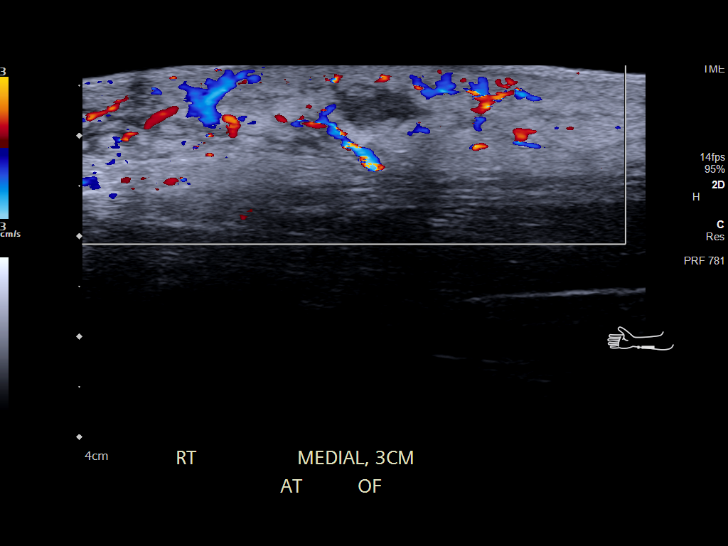
[im 3/10]
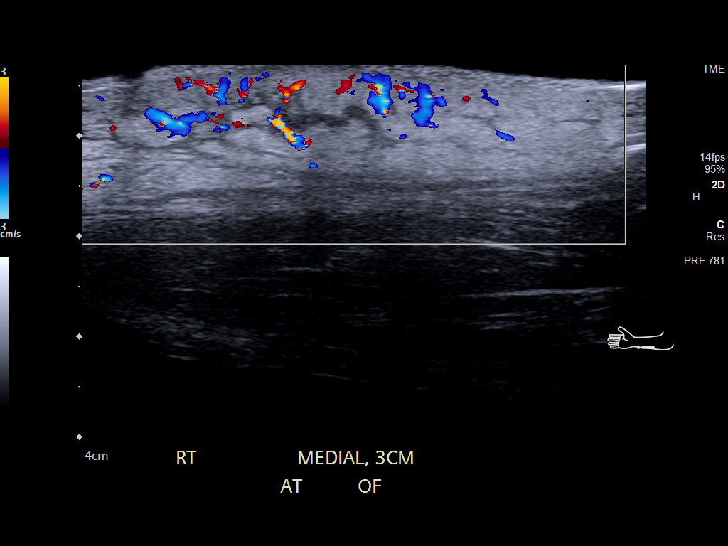
[im 4/10]
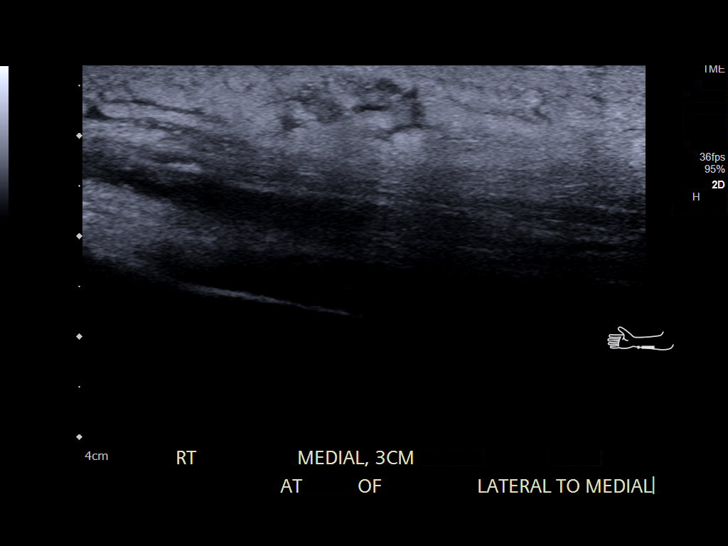
[im 5/10]
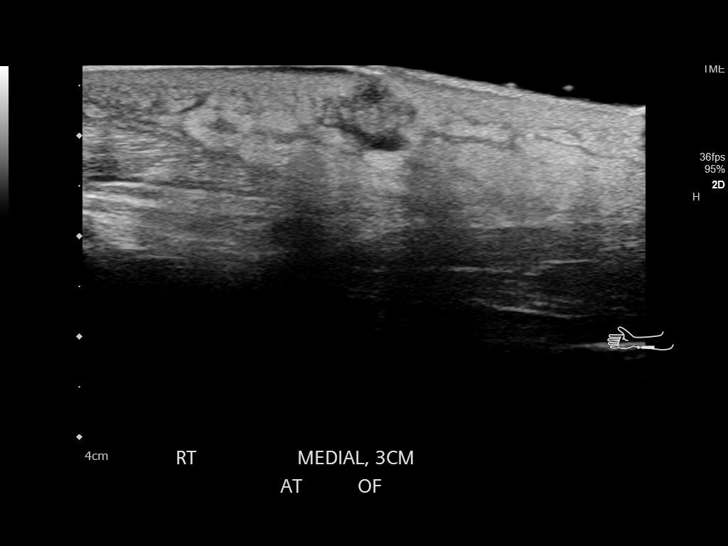
[im 6/10]
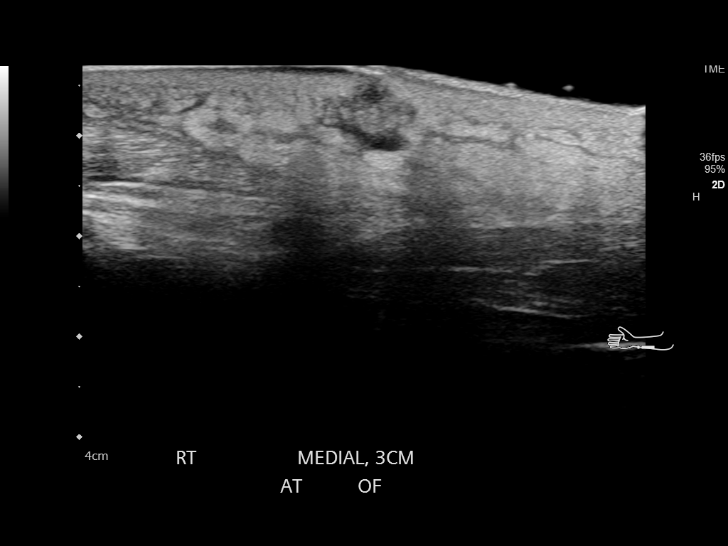
[im 7/10]
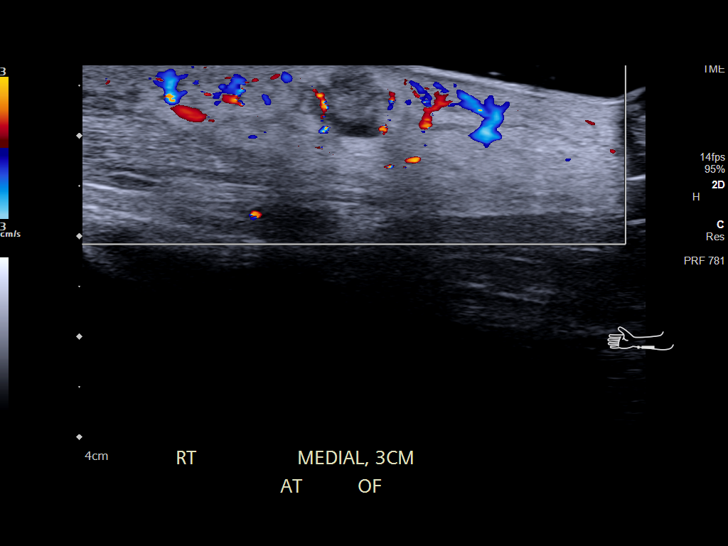
[im 8/10]
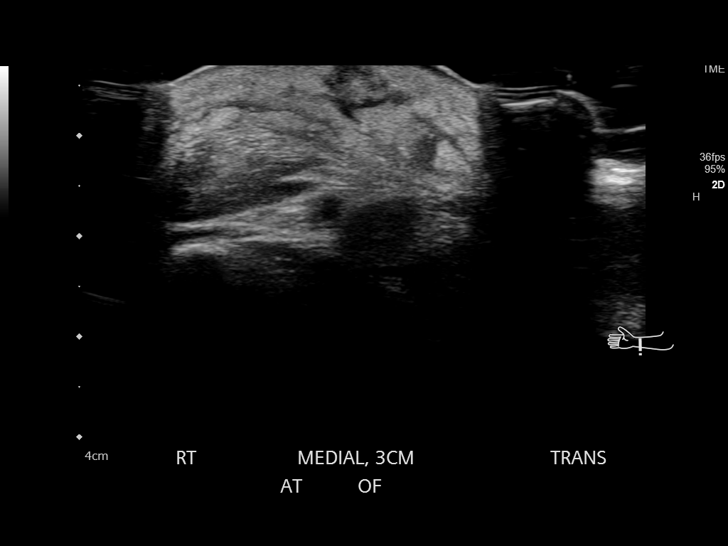
[im 9/10]
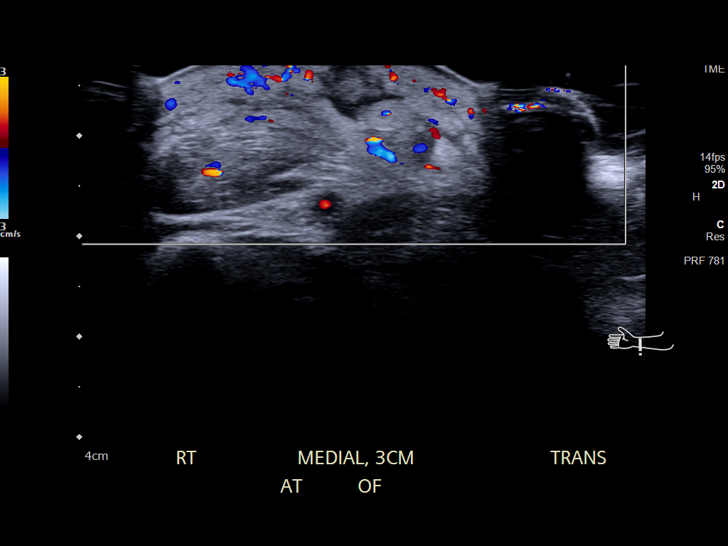
[im 10/10]
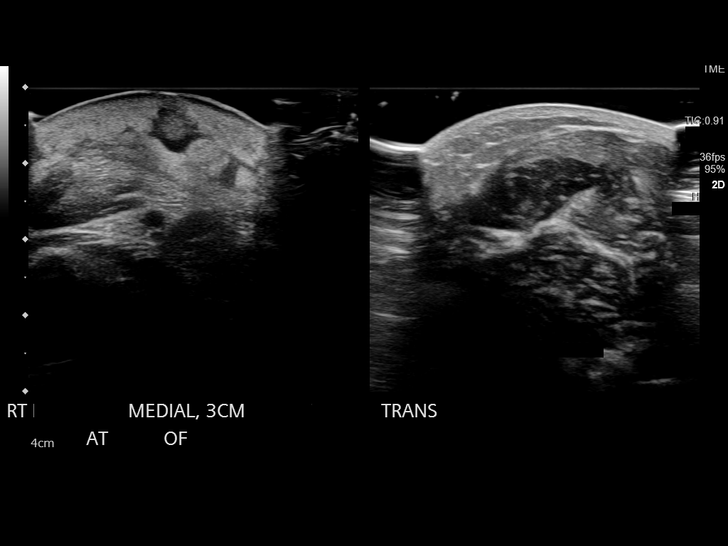

[10 of 10 positions shown; findings below may reference images not displayed]

FINDINGS: In the patient's palpable area of concern in the right forearm,
there is an area of skin thickening and subcutaneous fat stranding.
There is a small fluid collection deep to the cutaneous surface
measuring approximately 0.5 cm. There is surrounding hyperemia.
IMPRESSION: In the patient's palpable area of concern, there is skin thickening
and hyperemic changes which can be seen in the setting of
cellulitis. There is a small 0.5 cm complex collection deep to the
skin surface that may represent a developing abscess. This
collection appears to connect to the cutaneous surface on multiple
sonographic images.

## 2021-04-04 ENCOUNTER — Other Ambulatory Visit: Payer: Self-pay | Admitting: Orthopedic Surgery

## 2021-04-04 ENCOUNTER — Ambulatory Visit
Admission: RE | Admit: 2021-04-04 | Discharge: 2021-04-04 | Disposition: A | Discharge status: Home / Self Care | Payer: Medicare Other | Source: Ambulatory Visit | Attending: Orthopedic Surgery | Admitting: Orthopedic Surgery

## 2021-04-04 ENCOUNTER — Ambulatory Visit: Admission: RE | Admit: 2021-04-04 | Payer: Medicare Other | Source: Ambulatory Visit

## 2021-04-04 DIAGNOSIS — R222 Localized swelling, mass and lump, trunk: Secondary | ICD-10-CM

## 2021-04-04 DIAGNOSIS — R2242 Localized swelling, mass and lump, left lower limb: Secondary | ICD-10-CM | POA: Diagnosis present

## 2021-04-14 ENCOUNTER — Other Ambulatory Visit: Payer: Medicare Other

## 2022-07-21 ENCOUNTER — Other Ambulatory Visit: Payer: Self-pay | Admitting: Family

## 2022-07-21 DIAGNOSIS — Z1231 Encounter for screening mammogram for malignant neoplasm of breast: Secondary | ICD-10-CM

## 2022-07-28 ENCOUNTER — Ambulatory Visit: Payer: 59 | Attending: Orthopedic | Admitting: Physical Therapy

## 2022-07-28 DIAGNOSIS — R262 Difficulty in walking, not elsewhere classified: Secondary | ICD-10-CM | POA: Insufficient documentation

## 2022-07-28 DIAGNOSIS — M5442 Lumbago with sciatica, left side: Secondary | ICD-10-CM | POA: Insufficient documentation

## 2022-07-28 DIAGNOSIS — G8929 Other chronic pain: Secondary | ICD-10-CM | POA: Diagnosis present

## 2022-07-28 DIAGNOSIS — M6281 Muscle weakness (generalized): Secondary | ICD-10-CM | POA: Diagnosis present

## 2022-07-28 NOTE — Therapy (Signed)
OUTPATIENT PHYSICAL THERAPY THORACOLUMBAR EVALUATION   Patient Name: Samantha Watson MRN: [id number] DOB:[birth year], 52 y.o., female Today's Date: 07/28/2022  END OF SESSION:  PT End of Session - 07/28/22 0841     Visit Number 1    Date for PT Re-Evaluation 09/08/22    Authorization Type [employer]    Progress Note Due on Visit 10    PT Start Time 0900    PT Stop Time 0944    PT Time Calculation (min) 44 min    Behavior During Therapy [facility] for tasks assessed/performed             Past Medical History:  Diagnosis Date   Hyperlipidemia    Hypertension    MI (myocardial infarction) (HCC) 2016   Seizures (HCC)    Past Surgical History:  Procedure Laterality Date   CARDIAC CATHETERIZATION  2016   possible    LEFT HEART CATH AND CORONARY ANGIOGRAPHY Left 04/07/2017   Procedure: LEFT HEART CATH AND CORONARY ANGIOGRAPHY;  Surgeon: Marcina Millard, MD;  Location: [facility] INVASIVE CV LAB;  Service: Cardiovascular;  Laterality: Left;   There are no problems to display for this patient.   PCP: Etheleen Nicks, NP  REFERRING PROVIDER: Gerlene Burdock, NP  REFERRING DIAG:  [phone] (ICD-10-CM) - Lumbago with sciatica, left side  G89.29 (ICD-10-CM) - Other chronic pain  M54.16 (ICD-10-CM) - Radiculopathy, lumbar region  M48.061 (ICD-10-CM) - Spinal stenosis, lumbar region without neurogenic claudication    RATIONALE FOR EVALUATION AND TREATMENT: Rehabilitation  THERAPY DIAG: Chronic left-sided low back pain with left-sided sciatica  Difficulty in walking, not elsewhere classified  ONSET DATE: 07/2020  FOLLOW-UP APPT SCHEDULED WITH REFERRING PROVIDER: {yes/no:20286}    SUBJECTIVE:                                                                                                                                                                                         SUBJECTIVE STATEMENT:  Pt is a 52 year old female with primary complaint of low back pain with left-sided  pain/sciatica.  PERTINENT HISTORY: Pt is a 52 year old female with primary complaint of low back pain with left-sided pain/sciatica. Atraumatic onset. Pt reports getting up one day and noticing it hurting more without specific injury. Pt reports being unable to complete hobbies (e.g. planting flowers, walking exercise regimen). Hx of MI in 2016. Pt reports good cardiology follow-ups since then. Pt reports some pain into LLE. Pt reports some AM stiffness. Pt reports pain down to distal shin/distal leg region. Patient reports that medicine recommended by MD helps "a little." Pt feels she is getting adequate sleep each day.   PAIN:  Pain Intensity: Present: 8/10, Best: 6/10, Worst: 10/10 Pain location: L flank and radiating down length of LLE  Pain Quality: burning  Radiating: Yes , down LLE  Numbness/Tingling: Yes Focal Weakness: Yes, sometimes it feels like she may fall Aggravating factors: walking, mild pain with standing, mild pain with sitting (pt alternates between sitting/standing frequently),  Relieving factors: Tylenol Arthritis, pillow behind back in sitting 24-hour pain behavior: variable, sometimes worse in day and sometimes at night  History of prior back injury, pain, surgery, or therapy: No  Imaging: Yes ,  Heterogeneous bone marrow signal abnormality likely a sequela of degenerative change. Small benign osseous hemangioma in the L3 vertebral body. The conus medullaris ends at a normal level.  The vertebral bodies are normally aligned. 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 Multilevel disc space narrowing and disc desiccation.  T12-L1: Mild concentric disc bulge with no significant spinal canal or neural foraminal narrowing.  L1-L2: No significant spinal canal or neural foraminal narrowing. There is mild bilateral facet arthropathy.  L2-L3: Mild concentric disc bulge with no significant spinal canal narrowing.  Mild bilateral neuroforaminal narrowing, greater on the left due to asymmetric facet arthropathy.  L3-L4: Moderate spinal canal narrowing due to a combination of concentric disc bulge with superimposed small right subarticular disc protrusion resulting in narrowing of the right lateral recess and indenting/impinging the traversing right L4 nerve root. Moderate bilateral neuroforaminal narrowing due to prominent facet arthropathy and disc bulging extending into the foraminal zones.  L4-L5: Moderate spinal canal narrowing due to a combination of concentric disc bulge and bilateral facet arthropathy. Disc bulge slightly indents the bilateral traversing L5 nerve roots, greater on the right. There is posterior disc annular fissuring. Moderate to severe bilateral neuroforaminal narrowing, greater on the left.  L5-S1: Mild disc bulging with no significant spinal canal or neural foraminal narrowing. There is bilateral facet arthropathy.    Red flags: Negative for bowel/bladder changes, saddle paresthesia, personal history of cancer, h/o spinal tumors, h/o compression fx, h/o abdominal aneurysm, abdominal pain, chills/fever, night sweats, nausea, vomiting, unrelenting pain, first onset of insidious LBP <20 y/o   PRECAUTIONS: None  WEIGHT BEARING RESTRICTIONS: No  FALLS: Has patient fallen in last 6 months? No  Living Environment Lives with:  pt lives beside her father and her stepmom Lives in: House/apartment   Prior level of function: Independent  Occupational demands: -  Hobbies: Gardening, planting flowers, outdoor walking  Patient Goals: Improved pain, return to hobbies   OBJECTIVE:  Patient Surveys  FOTO ***  Cognition Patient is oriented to person, place, and time.  Recent memory is intact.  Remote memory is intact.  Attention span and concentration are intact.  Expressive speech is intact.  Patient's fund of knowledge is within normal limits for educational level.    Gross  Musculoskeletal Assessment Tremor: None Bulk: Normal Tone: Normal No visible step-off along spinal column, no signs of scoliosis  GAIT: Distance walked: *** Assistive device utilized: {Assistive devices:23999} Level of assistance: {Levels of assistance:24026} Comments: ***  Posture: Lumbar lordosis: WNL Iliac crest height: Equal bilaterally Lumbar lateral shift: Negative  AROM AROM (Normal range in degrees) AROM   Lumbar   Flexion (65) 75%*  Extension (30) 75%*  Right lateral flexion (25) 100% (pull on L flank)  Left lateral flexion (25) 100%  Right rotation (30) 100%*  Left rotation (30) 100%      Hip Right Left  Flexion (125)    Extension (  15)    Abduction (40)    Adduction     Internal Rotation (45)    External Rotation (45)        Knee    Flexion (135)    Extension (0)        Ankle    Dorsiflexion (20)    Plantarflexion (50)    Inversion (35)    Eversion (15)    (* = pain; Blank rows = not tested)  LE MMT: MMT (out of 5) Right  Left   Hip flexion 4-* 4-*  Hip extension    Hip abduction (seated) 4 4  Hip adduction 5 5  Hip internal rotation    Hip external rotation    Knee flexion    Knee extension 4+ 4  Ankle dorsiflexion 4+ 4+  Ankle plantarflexion    Ankle inversion    Ankle eversion    (* = pain; Blank rows = not tested)  Sensation Grossly intact to light touch throughout bilateral LEs as determined by testing dermatomes L2-S2. Proprioception, stereognosis, and hot/cold testing deferred on this date.  Reflexes R/L Knee Jerk (L3/4): 3+/3+  Ankle Jerk (S1/2): 3+/3+   Muscle Length Hamstrings: R: {NEGATIVE/POSITIVE ZOX:09604} L: {NEGATIVE/POSITIVE VWU:98119} Ely (quadriceps): R: {NEGATIVE/POSITIVE JYN:82956} L: {NEGATIVE/POSITIVE OZH:08657} Thomas (hip flexors): R: {NEGATIVE/POSITIVE QIO:96295} L: {NEGATIVE/POSITIVE MWU:13244} Ober: R: {NEGATIVE/POSITIVE WNU:27253} L: {NEGATIVE/POSITIVE GUY:40347}  Palpation Location Right Left          Lumbar paraspinals    Quadratus Lumborum    Gluteus Maximus    Gluteus Medius    Deep hip external rotators    PSIS    Fortin's Area (SIJ)    Greater Trochanter    (Blank rows = not tested) Graded on 0-4 scale (0 = no pain, 1 = pain, 2 = pain with wincing/grimacing/flinching, 3 = pain with withdrawal, 4 = unwilling to allow palpation)  Passive Accessory Intervertebral Motion Pt denies reproduction of back pain with CPA L1-L5 and UPA bilaterally L1-L5. Generally, hypomobile throughout  Special Tests Lumbar Radiculopathy and Discogenic: Centralization and Peripheralization (SN 92, -LR 0.12): *** Slump (SN 83, -LR 0.32): R: Negative L: Negative SLR (SN 92, -LR 0.29): R: Negative L:  Negative   Clonus: 2-3 beats RLE, 3-4 beats LLE Hoffman's Sign: R Negative L Negative Babinski: R Negative, L Negative    Facet Joint: Extension-Rotation (SN 100, -LR 0.0): R: Negative (pain on L with provocative position), L: Positive  Lumbar Foraminal Stenosis: Lumbar quadrant (SN 70): R: Negative L: Positive  Hip: FABER (SN 81): R: {NEGATIVE/POSITIVE FOR:19998} L: {NEGATIVE/POSITIVE QQV:95638} FADIR (SN 94): R: {NEGATIVE/POSITIVE FOR:19998} L: {NEGATIVE/POSITIVE VFI:43329} Hip scour (SN 50): R: {NEGATIVE/POSITIVE JJO:84166} L: {NEGATIVE/POSITIVE AYT:01601}  SIJ:  Thigh Thrust (SN 88, -LR 0.18) : R: {NEGATIVE/POSITIVE UXN:23557} L: {NEGATIVE/POSITIVE DUK:02542}  Piriformis Syndrome: FAIR Test (SN 88, SP 83): R: {NEGATIVE/POSITIVE HCW:23762} L: {NEGATIVE/POSITIVE GBT:51761} PACE Sign: Mild positive L   Functional Tasks Lifting: Deep squat: Sit to stand: Forward Step-Down Test: R:  L:  Lateral Step-Down Test: R:  L:   Beighton scale  LEFT  RIGHT           1. Passive dorsiflexion and hyperextension of the fifth MCP joint beyond 90  0 0   2. Passive apposition of the thumb to the flexor aspect of the forearm  0  0   3. Passive hyperextension of the elbow beyond 10  0  0   4. Passive  hyperextension of the knee beyond 10  0  0  5. Active forward flexion of the trunk with the knees fully extended so that the palms of the hands rest flat on the floor   0   TOTAL         0/ 9    Clinical Prediction Rule for Compression Fractures: 1. >52 y/o 2. no presence of leg pain 3. BMI <22 4. client not regularly exercising 5. female <1 of 5 = SN 97, - LR 0.16 >4 of 5 = SP 96, + LR 9.6  Clinical Prediction Rule for Spinal Stenosis: 1. Bilateral symptoms 2. Leg pain > back pain 3. Pain during walking or standing 4. Pain relief when sitting 5. Age >30 y/o <1 (+) finding = rule out stenosis (SN 96) 4 of 5 (+) findings = rule in stenosis (SP 98)  Clinical Prediction Rule for Facet Joint Syndrome (check pg 460): 1. Age > 12 y/o 2. Symptoms best when walking 3. Symptoms best when sitting 4. Onset of pain is paraspinal 5. (+) lumbar extension-rotation test >3 (+) findings = SP 91, +LR 9.7 <2 (+) findings = SN 100  Pain Provocation Cluster for SIJ Dysfunction Thigh Thrust Test (SN 88, -LR 0.18) Gaenslen's Test Distraction Test Compression Test Sacral Thrust Test  3 (+) tests: if discogenic pain has been ruled out through repeated extensions; SN 94, -LR 0.80  Rule out Hip OA (SN 86) Hip pain Hip IR <15 degrees morning stiffness < 60 min > 50 y/o  Neurogenic vs Vascular Claudication (Rieman, 2016)  Description Neurogenic Vascular  Quality of pain cramping Burning, cramping  LBP Frequently present Absent   Sensory symptoms Frequently present Absent  Muscle weakness Frequently present Absent  Reflex changes Frequently present Absent  Bicycle test Symptoms only when sitting upright Symptoms not position dependent  Arterial pulses Normal  Decreased or absent  Skin/dystrophic changes Absent  Frequently present  Aggravating factors Upright posture, extension of trunk Any leg activity  Relieving factors Sitting, bending forward Rest, no leg activity  Walking uphill  Symptoms produced later Not position dependent  Walking downhill Symptoms produced earlier Not position dependent   Lumbar Spinal Stenosis vs Radiculopathy due to Disc Herniation (Rieman, 2016)  Description Spinal Stenosis Disc Herniation  Age Usually > 66 y/o Usually < 59 y/o  Onset Usually more insidious Usually more sudden  Position change: flexion Better  Worse  Position change: extension Worse Better  Focal muscle weakness Less common Can be common  Dural tension Less common Common  UMN or LMN involvement  Central stenosis: UMN Lateral foraminal stenosis: LMN LMN     TODAY'S TREATMENT: DATE: ***     PATIENT EDUCATION:  Education details: *** Person educated: {Person educated:25204} Education method: {Education Method:25205} Education comprehension: {Education Comprehension:25206}   HOME EXERCISE PROGRAM:     ASSESSMENT:  CLINICAL IMPRESSION: Patient is a *** y.o. *** who was seen today for physical therapy evaluation and treatment for ***.   OBJECTIVE IMPAIRMENTS: {opptimpairments:25111}.   ACTIVITY LIMITATIONS: {activitylimitations:27494}  PARTICIPATION LIMITATIONS: {participationrestrictions:25113}  PERSONAL FACTORS: {Personal factors:25162} are also affecting patient's functional outcome.   REHAB POTENTIAL: {rehabpotential:25112}  CLINICAL DECISION MAKING: {clinical decision making:25114}  EVALUATION COMPLEXITY: {Evaluation complexity:25115}   GOALS: Goals reviewed with patient? {yes/no:20286}  SHORT TERM GOALS: Target date: {follow up:25551}  Pt will be independent with HEP in order to improve strength and decrease back pain to improve pain-free function at home and work. Baseline: *** Goal status: INITIAL   LONG TERM GOALS: Target date: {follow up:25551}  Pt will increase FOTO  to at least *** to demonstrate significant improvement in function at home and work related to back pain  Baseline:  Goal status: INITIAL  2.  Pt will decrease worst  back pain by at least 2 points on the NPRS in order to demonstrate clinically significant reduction in back pain. Baseline: *** Goal status: INITIAL  3.  Pt will decrease mODI score by at least 13 points in order demonstrate clinically significant reduction in back pain/disability.       Baseline: *** Goal status: INITIAL  4.  *** Baseline: *** Goal status: INITIAL   PLAN: PT FREQUENCY: 1-2x/week  PT DURATION: {rehab duration:25117}  PLANNED INTERVENTIONS: Therapeutic exercises, Therapeutic activity, Neuromuscular re-education, Balance training, Gait training, Patient/Family education, Self Care, Joint mobilization, Joint manipulation, Vestibular training, Canalith repositioning, Orthotic/Fit training, DME instructions, Dry Needling, Electrical stimulation, Spinal manipulation, Spinal mobilization, Cryotherapy, Moist heat, Taping, Traction, Ultrasound, Ionotophoresis 4mg /ml Dexamethasone, Manual therapy, and Re-evaluation.  PLAN FOR NEXT SESSION: ***   Lynnea Maizes PT, DPT, GCS  Gertie Exon, PT 07/28/2022, 8:43 AM

## 2022-07-29 ENCOUNTER — Encounter: Payer: Self-pay | Admitting: Physical Therapy

## 2022-07-30 ENCOUNTER — Ambulatory Visit: Payer: 59 | Admitting: Physical Therapy

## 2022-07-30 DIAGNOSIS — G8929 Other chronic pain: Secondary | ICD-10-CM

## 2022-07-30 DIAGNOSIS — M5442 Lumbago with sciatica, left side: Secondary | ICD-10-CM | POA: Diagnosis not present

## 2022-07-30 DIAGNOSIS — R262 Difficulty in walking, not elsewhere classified: Secondary | ICD-10-CM

## 2022-07-30 DIAGNOSIS — M6281 Muscle weakness (generalized): Secondary | ICD-10-CM

## 2022-07-30 NOTE — Therapy (Signed)
OUTPATIENT PHYSICAL THERAPY TREATMENT NOTE   Patient Name: Samantha Watson MRN: [id number] DOB:February 29, 1970, 52 y.o., female Today's Date: 07/30/2022   END OF SESSION:   PT End of Session - 07/30/22 0855     Visit Number 2    Date for PT Re-Evaluation 09/08/22    Authorization Type [employer]    Progress Note Due on Visit 10    PT Start Time 0900    PT Stop Time 0942    PT Time Calculation (min) 42 min    Activity Tolerance Patient tolerated treatment well    Behavior During Therapy [facility] for tasks assessed/performed             Past Medical History:  Diagnosis Date   Hyperlipidemia    Hypertension    MI (myocardial infarction) (HCC) 2016   Seizures (HCC)    Past Surgical History:  Procedure Laterality Date   CARDIAC CATHETERIZATION  2016   possible    LEFT HEART CATH AND CORONARY ANGIOGRAPHY Left 04/07/2017   Procedure: LEFT HEART CATH AND CORONARY ANGIOGRAPHY;  Surgeon: Marcina Millard, MD;  Location: [facility] INVASIVE CV LAB;  Service: Cardiovascular;  Laterality: Left;   There are no problems to display for this patient.   PCP: Etheleen Nicks, NP  REFERRING PROVIDER: Gerlene Burdock, NP   REFERRING DIAG:  [phone] (ICD-10-CM) - Lumbago with sciatica, left side  G89.29 (ICD-10-CM) - Other chronic pain  M54.16 (ICD-10-CM) - Radiculopathy, lumbar region  M48.061 (ICD-10-CM) - Spinal stenosis, lumbar region without neurogenic claudication    THERAPY DIAG:  Chronic left-sided low back pain with left-sided sciatica  Difficulty in walking, not elsewhere classified  Muscle weakness (generalized)  Rationale for Evaluation and Treatment Rehabilitation  PERTINENT HISTORY:  Pt is a 52 year old female with primary complaint of low back pain with left-sided pain/sciatica. Atraumatic onset. Pt reports getting up one day and noticing it hurting more without specific injury. Pt reports being unable to complete hobbies (e.g. planting flowers, walking exercise regimen).  Hx of MI in 2016. Pt reports good cardiology follow-ups since then. Pt reports some pain into LLE. Pt reports some AM stiffness. Pt reports pain down to distal shin/distal leg region. Patient reports that medicine recommended by MD helps "a little." Pt feels she is getting adequate sleep each day.    PAIN:    Pain Intensity: Present: 8/10, Best: 6/10, Worst: 10/10 Pain location: L flank and radiating down length of LLE  Pain Quality: burning  Radiating: Yes , down LLE  Numbness/Tingling: Yes Focal Weakness: Yes, sometimes it feels like she may fall Aggravating factors: walking, mild pain with standing, mild pain with sitting (pt alternates between sitting/standing frequently),  Relieving factors: Tylenol Arthritis, pillow behind back in sitting 24-hour pain behavior: variable, sometimes worse in day and sometimes at night  History of prior back injury, pain, surgery, or therapy: No   Imaging: Yes ,   EXAM: Magnetic resonance imaging, spinal canal and contents, lumbar, without contrast material.    Heterogeneous bone marrow signal abnormality likely a sequela of degenerative change. Small benign osseous hemangioma in the L3 vertebral body. The conus medullaris ends at a normal level.  The vertebral bodies are normally aligned. 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 Multilevel disc space narrowing and disc desiccation.  T12-L1: Mild concentric disc bulge with no significant spinal canal or neural foraminal narrowing.  L1-L2: No significant spinal canal or neural foraminal narrowing. There is mild bilateral facet arthropathy.  L2-L3: Mild concentric disc bulge with no significant spinal canal narrowing. Mild bilateral neuroforaminal narrowing, greater on the left due to asymmetric facet arthropathy.  L3-L4: Moderate spinal canal narrowing due to a combination of concentric disc bulge with superimposed small right subarticular  disc protrusion resulting in narrowing of the right lateral recess and indenting/impinging the traversing right L4 nerve root. Moderate bilateral neuroforaminal narrowing due to prominent facet arthropathy and disc bulging extending into the foraminal zones.  L4-L5: Moderate spinal canal narrowing due to a combination of concentric disc bulge and bilateral facet arthropathy. Disc bulge slightly indents the bilateral traversing L5 nerve roots, greater on the right. There is posterior disc annular fissuring. Moderate to severe bilateral neuroforaminal narrowing, greater on the left.  L5-S1: Mild disc bulging with no significant spinal canal or neural foraminal narrowing. There is bilateral facet arthropathy.        Red flags: Negative for bowel/bladder changes, saddle paresthesia, personal history of cancer, h/o spinal tumors, h/o compression fx, h/o abdominal aneurysm, abdominal pain, chills/fever, night sweats, nausea, vomiting, unrelenting pain, first onset of insidious LBP <20 y/o     PRECAUTIONS: None   WEIGHT BEARING RESTRICTIONS: No   FALLS: Has patient fallen in last 6 months? No   Living Environment Lives with:  pt lives beside her father and her stepmom Lives in: House/apartment     Prior level of function: Independent   Occupational demands: -   Hobbies: Gardening, planting flowers, outdoor walking   Patient Goals: Improved pain, return to hobbies      PRECAUTIONS: PCN allergy   SUBJECTIVE:                                                                                                                                                                                      SUBJECTIVE STATEMENT:  Patient reports similar level of ongoing pain affecting L side. She feels she tolerates initial home program okay. Patient reports pain going down as far as her knee. 7/10 pain at arrival.   PAIN:  Are you having pain? 7/10 pain in L flank and down to L knee     OBJECTIVE:  (objective measures completed at initial evaluation unless otherwise dated)   Patient Surveys  FOTO 63, predicted outcome score of 67         GAIT: Distance walked: 60 ft Assistive device utilized: None Level of assistance: Complete Independence Comments: Antalgic pattern with dec stance time on L side, decreased arm swing/trunk rotation   Posture: Guarded posture of thoracolumbar spine Lumbar lordosis: WNL Lumbar lateral shift: Negative   AROM     AROM (Normal range in degrees) AROM  Lumbar    Flexion (65) 75%*  Extension (30) 75%*  Right lateral flexion (25) 100% (pull on L flank)  Left lateral flexion (25) 100%  Right rotation (30) 100%*  Left rotation (30) 100%         Hip Right Left  Flexion (125)      Extension (15)      Abduction (40)      Adduction       Internal Rotation (45)      External Rotation (45)             (* = pain; Blank rows = not tested)     LE MMT: MMT (out of 5) Right   Left    Hip flexion 4-* 4-*  Hip extension      Hip abduction (seated) 4 4  Hip adduction 5 5  Hip internal rotation      Hip external rotation      Knee flexion      Knee extension 4+ 4  Ankle dorsiflexion 4+ 4+  Ankle plantarflexion      Ankle inversion      Ankle eversion      (* = pain; Blank rows = not tested)   Sensation Grossly intact to light touch throughout bilateral LEs as determined by testing dermatomes L2-S2. Proprioception, stereognosis, and hot/cold testing deferred on this date.   Reflexes R/L Knee Jerk (L3/4): 3+/3+  Ankle Jerk (S1/2): 3+/3+  [Pt is generally hyperreflexic]   Muscle Length Hamstrings: R: Negative L: Negative E[place] (quadriceps): R: Positive L: Positive     Palpation Location Right Left         Lumbar paraspinals 0 2  Quadratus Lumborum 0 0  Gluteus Maximus 0 2  Gluteus Medius 0 2  Deep hip external rotators 0 2  PSIS 1 1  Fortin's Area (SIJ)      Greater Trochanter      (Blank rows = not tested) Graded on 0-4  scale (0 = no pain, 1 = pain, 2 = pain with wincing/grimacing/flinching, 3 = pain with withdrawal, 4 = unwilling to allow palpation)   Passive Accessory Intervertebral Motion Reproduction of back pain with CPA L3-S1, empty end-feel   Special Tests Lumbar Radiculopathy and Discogenic: Centralization and Peripheralization (SN 92, -LR 0.12): Minimal c/o pain in lying following repeated extension in lying (double knee to chest) Slump (SN 83, -LR 0.32): R: Negative L: Negative SLR (SN 92, -LR 0.29): R: Negative L:  Negative   Clonus: 2-3 beats RLE, 3-4 beats LLE Hoffman's Sign: R Negative L Negative Babinski: R Negative, L Negative    Lumbar spine compression: Positive for pain provocation Lumbar spine general distraction in hooklying: Negative for symptom relief    Facet Joint: Extension-Rotation (SN 100, -LR 0.0): R: Negative (pain on L with provocative position), L: Positive   Lumbar Foraminal Stenosis: Lumbar quadrant (SN 70): R: Negative L: Positive    Piriformis Syndrome: PACE Sign: Mild positive L       TODAY'S TREATMENT:   Therapeutic Exercise - for improved soft tissue flexibility and extensibility as needed for ROM, MDT/repeated movement to modulate pain and improve ROM  Repeated flexion in lying (double knee to chest); 1x10 Lower trunk rotation; x15 alternating R/L Piriformis stretch, figure-4 pull; 2 x 30 sec  Posterior pelvic tilt; 2x10  AROM Lumbar flexion 75%* Lumbar extension 75%* Lateral flexion: R 100%, L 100% Thoracolumbar rotation: R 100%*, L 100%   PATIENT EDUCATION: Encouraged continued HEP, reiterated centralization  phenomenon, and discussed activity modification given notable aversion to extension-biased movements/postures.    MHP (unbilled) utilized during manual therapy for analgesic effect and improved soft tissue extensibility.; along low back/superior gluteal region with pt in prone lying; 2 pillows under pelvis; x 5 minutes     Manual  Therapy - for symptom modulation, soft tissue sensitivity and mobility, joint mobility, ROM   STM and IASTM with [employer] along L erector spine L3-5 and L gluteal mm; x 15 minutes LLE long-leg distraction with pt in hooklying on table; 10 sec on, 5 sec off; x 3 minutes       PATIENT EDUCATION:  Education details: see above for patient education details Person educated: Patient Education method: Explanation, Demonstration, and Handouts Education comprehension: verbalized understanding and returned demonstration     HOME EXERCISE PROGRAM:  Access Code: [email or web address] URL: [email or web address] Date: 07/30/2022 Prepared by: Consuela Mimes   Exercises - Supine Double Knee to Chest  - 5-6 x daily - 7 x weekly - 1 sets - 10 reps - 1sec hold - Supine Piriformis Stretch with Foot on Ground  - 2 x daily - 7 x weekly - 3 sets - 30sec hold     ASSESSMENT:   CLINICAL IMPRESSION: Patient does not report significant pain into L calf/ankle with repeated movement or brief ROM screen. She only reports this briefly following manual therapy. Pt has notable sensitivity along L lumbar paraspinals and L gluteal musculature; manual therapy was completed at low intensity and we used moist heat to diminish acute discomfort. Pt participates well with PT, but does provide tenuous feedback regarding response to treatment interventions. Pt has remaining impairments in thoracolumbar AROM, quadriceps tightness, taut/tender gluteal mm and deep hip ER, postural changes, and L lumbar flank pain with LLE referral as far as L ankle. . Patient will benefit from continued skilled therapeutic intervention to address the above deficits as needed for improved function and QoL.       OBJECTIVE IMPAIRMENTS: Abnormal gait, difficulty walking, decreased ROM, decreased strength, hypomobility, impaired flexibility, postural dysfunction, and pain.    ACTIVITY LIMITATIONS: lifting, sitting, standing, transfers, and  locomotion level   PARTICIPATION LIMITATIONS: shopping, community activity, and outdoor walking   PERSONAL FACTORS: Past/current experiences, Time since onset of injury/illness/exacerbation, and 3+ comorbidities: (Hx of MI, HLD, HTN, seizure disorder)  are also affecting patient's functional outcome.    REHAB POTENTIAL: Good   CLINICAL DECISION MAKING: Evolving/moderate complexity   EVALUATION COMPLEXITY: Moderate     GOALS: Goals reviewed with patient? No   SHORT TERM GOALS: Target date: 08/20/2022   Pt will be independent with HEP in order to improve strength and decrease back pain to improve pain-free function at home and work. Baseline: 07/28/22: Baseline HEP initiated with focus on repeated flexion and piriformis stretching.  Goal status: INITIAL     LONG TERM GOALS: Target date: 09/10/2022   Pt will increase FOTO to at least 67 to demonstrate significant improvement in function at home and work related to back pain  Baseline: 07/28/22: 63 Goal status: INITIAL   2.  Pt will decrease worst back pain by at least 2 points on the NPRS in order to demonstrate clinically significant reduction in back pain. Baseline: 07/28/22: 10/10 at worst  Goal status: INITIAL   3.  Patient will have full thoracolumbar AROM without reproduction of pain as needed for reaching items on ground, household chores, bending.    Baseline: 07/28/22: pain with R sidebend/rotation, pain and motion loss with flexion  and extension.  Goal status: INITIAL   4.  Pt will resume outdoor walking as desired without reproduction of pain > 0-1/10 as needed for participation in her regular hobby Baseline: 07/28/22: Pt limited in outdoor walking routine Goal status: INITIAL     PLAN: PT FREQUENCY: 1-2x/week   PT DURATION: 6 weeks   PLANNED INTERVENTIONS: Therapeutic exercises, Therapeutic activity, Neuromuscular re-education, Balance training, Gait training, Patient/Family education, Self Care, Joint mobilization,  Joint manipulation, Vestibular training, Canalith repositioning, Orthotic/Fit training, DME instructions, Dry Needling, Electrical stimulation, Spinal manipulation, Spinal mobilization, Cryotherapy, Moist heat, Taping, Traction, Ultrasound, Ionotophoresis 4mg /ml Dexamethasone, Manual therapy, and Re-evaluation.   PLAN FOR NEXT SESSION: Assess response to repeated flexion, utilize MT and STM for sensitive/tight L paraspinals and glutes, progress with flexion-bias exercise as able.     Consuela Mimes, PT, DPT #[id number]  Gertie Exon, PT 07/30/2022, 9:00 AM

## 2022-08-04 ENCOUNTER — Ambulatory Visit: Payer: 59 | Admitting: Physical Therapy

## 2022-08-06 ENCOUNTER — Encounter: Payer: Self-pay | Admitting: Physical Therapy

## 2022-08-06 ENCOUNTER — Ambulatory Visit: Payer: 59 | Admitting: Physical Therapy

## 2022-08-06 DIAGNOSIS — M6281 Muscle weakness (generalized): Secondary | ICD-10-CM

## 2022-08-06 DIAGNOSIS — R262 Difficulty in walking, not elsewhere classified: Secondary | ICD-10-CM

## 2022-08-06 DIAGNOSIS — M5442 Lumbago with sciatica, left side: Secondary | ICD-10-CM | POA: Diagnosis not present

## 2022-08-06 DIAGNOSIS — G8929 Other chronic pain: Secondary | ICD-10-CM

## 2022-08-06 NOTE — Therapy (Signed)
OUTPATIENT PHYSICAL THERAPY TREATMENT NOTE   Patient Name: Samantha Watson MRN: [id number] DOB:1970/10/30, 52 y.o., female Today's Date: 08/06/2022   END OF SESSION:   PT End of Session - 08/06/22 0851     Visit Number 3    Date for PT Re-Evaluation 09/08/22    Authorization Type [employer]    Progress Note Due on Visit 10    PT Start Time 0852    PT Stop Time 0938    PT Time Calculation (min) 46 min    Activity Tolerance Patient tolerated treatment well    Behavior During Therapy [facility] for tasks assessed/performed              Past Medical History:  Diagnosis Date   Hyperlipidemia    Hypertension    MI (myocardial infarction) (HCC) 2016   Seizures (HCC)    Past Surgical History:  Procedure Laterality Date   CARDIAC CATHETERIZATION  2016   possible    LEFT HEART CATH AND CORONARY ANGIOGRAPHY Left 04/07/2017   Procedure: LEFT HEART CATH AND CORONARY ANGIOGRAPHY;  Surgeon: Marcina Millard, MD;  Location: [facility] INVASIVE CV LAB;  Service: Cardiovascular;  Laterality: Left;   There are no problems to display for this patient.   PCP: Etheleen Nicks, NP  REFERRING PROVIDER: Gerlene Burdock, NP   REFERRING DIAG:  [phone] (ICD-10-CM) - Lumbago with sciatica, left side  G89.29 (ICD-10-CM) - Other chronic pain  M54.16 (ICD-10-CM) - Radiculopathy, lumbar region  M48.061 (ICD-10-CM) - Spinal stenosis, lumbar region without neurogenic claudication    THERAPY DIAG:  Chronic left-sided low back pain with left-sided sciatica  Difficulty in walking, not elsewhere classified  Muscle weakness (generalized)  Rationale for Evaluation and Treatment Rehabilitation  PERTINENT HISTORY:  Pt is a 52 year old female with primary complaint of low back pain with left-sided pain/sciatica. Atraumatic onset. Pt reports getting up one day and noticing it hurting more without specific injury. Pt reports being unable to complete hobbies (e.g. planting flowers, walking exercise regimen).  Hx of MI in 2016. Pt reports good cardiology follow-ups since then. Pt reports some pain into LLE. Pt reports some AM stiffness. Pt reports pain down to distal shin/distal leg region. Patient reports that medicine recommended by MD helps "a little." Pt feels she is getting adequate sleep each day.    PAIN:    Pain Intensity: Present: 8/10, Best: 6/10, Worst: 10/10 Pain location: L flank and radiating down length of LLE  Pain Quality: burning  Radiating: Yes , down LLE  Numbness/Tingling: Yes Focal Weakness: Yes, sometimes it feels like she may fall Aggravating factors: walking, mild pain with standing, mild pain with sitting (pt alternates between sitting/standing frequently),  Relieving factors: Tylenol Arthritis, pillow behind back in sitting 24-hour pain behavior: variable, sometimes worse in day and sometimes at night  History of prior back injury, pain, surgery, or therapy: No   Imaging: Yes ,   EXAM: Magnetic resonance imaging, spinal canal and contents, lumbar, without contrast material.    Heterogeneous bone marrow signal abnormality likely a sequela of degenerative change. Small benign osseous hemangioma in the L3 vertebral body. The conus medullaris ends at a normal level.  The vertebral bodies are normally aligned. 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 Multilevel disc space narrowing and disc desiccation.  T12-L1: Mild concentric disc bulge with no significant spinal canal or neural foraminal narrowing.  L1-L2: No significant spinal canal or neural foraminal narrowing. There is mild bilateral facet  arthropathy.  L2-L3: Mild concentric disc bulge with no significant spinal canal narrowing. Mild bilateral neuroforaminal narrowing, greater on the left due to asymmetric facet arthropathy.  L3-L4: Moderate spinal canal narrowing due to a combination of concentric disc bulge with superimposed small right subarticular  disc protrusion resulting in narrowing of the right lateral recess and indenting/impinging the traversing right L4 nerve root. Moderate bilateral neuroforaminal narrowing due to prominent facet arthropathy and disc bulging extending into the foraminal zones.  L4-L5: Moderate spinal canal narrowing due to a combination of concentric disc bulge and bilateral facet arthropathy. Disc bulge slightly indents the bilateral traversing L5 nerve roots, greater on the right. There is posterior disc annular fissuring. Moderate to severe bilateral neuroforaminal narrowing, greater on the left.  L5-S1: Mild disc bulging with no significant spinal canal or neural foraminal narrowing. There is bilateral facet arthropathy.        Red flags: Negative for bowel/bladder changes, saddle paresthesia, personal history of cancer, h/o spinal tumors, h/o compression fx, h/o abdominal aneurysm, abdominal pain, chills/fever, night sweats, nausea, vomiting, unrelenting pain, first onset of insidious LBP <20 y/o     PRECAUTIONS: None   WEIGHT BEARING RESTRICTIONS: No   FALLS: Has patient fallen in last 6 months? No   Living Environment Lives with:  pt lives beside her father and her stepmom Lives in: House/apartment     Prior level of function: Independent   Occupational demands: -   Hobbies: Gardening, planting flowers, outdoor walking   Patient Goals: Improved pain, return to hobbies      PRECAUTIONS: PCN allergy   SUBJECTIVE:                                                                                                                                                                                      SUBJECTIVE STATEMENT:  Patient reports 7/10 pain at arrival to PT. Patient reports tolerating her exercises well. She reports having intermittent pain down length of her LE. Patient reports pain along L thigh versus distal lower limb. Pt reports a little bit of soreness after last visit.    PAIN:  Are  you having pain? 7/10 pain in L flank and down to L knee     OBJECTIVE: (objective measures completed at initial evaluation unless otherwise dated)   Patient Surveys  FOTO 63, predicted outcome score of 67         GAIT: Distance walked: 60 ft Assistive device utilized: None Level of assistance: Complete Independence Comments: Antalgic pattern with dec stance time on L side, decreased arm swing/trunk rotation   Posture: Guarded posture of thoracolumbar spine Lumbar lordosis: WNL Lumbar lateral shift: Negative  AROM     AROM (Normal range in degrees) AROM   Lumbar    Flexion (65) 75%*  Extension (30) 75%*  Right lateral flexion (25) 100% (pull on L flank)  Left lateral flexion (25) 100%  Right rotation (30) 100%*  Left rotation (30) 100%         Hip Right Left  Flexion (125)      Extension (15)      Abduction (40)      Adduction       Internal Rotation (45)      External Rotation (45)             (* = pain; Blank rows = not tested)     LE MMT: MMT (out of 5) Right   Left    Hip flexion 4-* 4-*  Hip extension      Hip abduction (seated) 4 4  Hip adduction 5 5  Hip internal rotation      Hip external rotation      Knee flexion      Knee extension 4+ 4  Ankle dorsiflexion 4+ 4+  Ankle plantarflexion      Ankle inversion      Ankle eversion      (* = pain; Blank rows = not tested)   Sensation Grossly intact to light touch throughout bilateral LEs as determined by testing dermatomes L2-S2. Proprioception, stereognosis, and hot/cold testing deferred on this date.   Reflexes R/L Knee Jerk (L3/4): 3+/3+  Ankle Jerk (S1/2): 3+/3+  [Pt is generally hyperreflexic]   Muscle Length Hamstrings: R: Negative L: Negative E[place] (quadriceps): R: Positive L: Positive     Palpation Location Right Left         Lumbar paraspinals 0 2  Quadratus Lumborum 0 0  Gluteus Maximus 0 2  Gluteus Medius 0 2  Deep hip external rotators 0 2  PSIS 1 1  Fortin's Area (SIJ)       Greater Trochanter      (Blank rows = not tested) Graded on 0-4 scale (0 = no pain, 1 = pain, 2 = pain with wincing/grimacing/flinching, 3 = pain with withdrawal, 4 = unwilling to allow palpation)   Passive Accessory Intervertebral Motion Reproduction of back pain with CPA L3-S1, empty end-feel   Special Tests Lumbar Radiculopathy and Discogenic: Centralization and Peripheralization (SN 92, -LR 0.12): Minimal c/o pain in lying following repeated extension in lying (double knee to chest) Slump (SN 83, -LR 0.32): R: Negative L: Negative SLR (SN 92, -LR 0.29): R: Negative L:  Negative   Clonus: 2-3 beats RLE, 3-4 beats LLE Hoffman's Sign: R Negative L Negative Babinski: R Negative, L Negative    Lumbar spine compression: Positive for pain provocation Lumbar spine general distraction in hooklying: Negative for symptom relief    Facet Joint: Extension-Rotation (SN 100, -LR 0.0): R: Negative (pain on L with provocative position), L: Positive   Lumbar Foraminal Stenosis: Lumbar quadrant (SN 70): R: Negative L: Positive    Piriformis Syndrome: PACE Sign: Mild positive L       TODAY'S TREATMENT:     Manual Therapy - for symptom modulation, soft tissue sensitivity and mobility, joint mobility, ROM   STM and IASTM with Hypervolt along bilat erector spine L3-5; x 15 minutes LLE long-leg distraction with pt in hooklying on table; 10 sec on, 5 sec off; x 1 minute  -discomfort in L knee with full extension position     Trigger Point Dry Needling (TDN),  unbilled Education performed with patient regarding potential benefit of TDN. Reviewed precautions and risks with patient. Extensive time spent with pt to ensure full understanding of TDN risks. Pt provided verbal consent to treatment. TDN performed to L iliocostalis lumborum at L3 and L4 with 0.30 x 70 single needle placements and L L4 multifidus with 0.25 x 60 single needle placements with local twitch response (LTR). Pistoning  technique utilized. Improved pain-free motion following intervention.    Therapeutic Exercise - for improved soft tissue flexibility and extensibility as needed for ROM, MDT/repeated movement to modulate pain and improve ROM   Lower trunk rotation; x10 alternating R/L SLR; 2x10 with each LE Posterior pelvic tilt; 2x10 Open book; 1x10 lying on R and L side   AROM Lumbar flexion 75%* Lumbar extension 75%* Lateral flexion: R 100%, L 100% Thoracolumbar rotation: R 100%*, L 100%   PATIENT EDUCATION: Encouraged continued HEP, and re-educated on swelling management for her L knee. HEP updated and new handout provided   *not today* Piriformis stretch, figure-4 pull; 2 x 30 sec  Repeated flexion in lying (double knee to chest); 1x10      PATIENT EDUCATION:  Education details: see above for patient education details Person educated: Patient Education method: Explanation, Demonstration, and Handouts Education comprehension: verbalized understanding and returned demonstration     HOME EXERCISE PROGRAM:  Access Code: [email or web address] URL: [email or web address] Date: 08/06/2022 Prepared by: Consuela Mimes  Exercises - Supine Double Knee to Chest  - 5-6 x daily - 7 x weekly - 1 sets - 10 reps - 1sec hold - Supine Piriformis Stretch with Foot on Ground  - 2 x daily - 7 x weekly - 3 sets - 30sec hold - Lower Trunk Rotations  - 2 x daily - 7 x weekly - 2 sets - 10 reps - Active Straight Leg Raise with Quad Set  - 1 x daily - 7 x weekly - 2 sets - 10 reps      ASSESSMENT:   CLINICAL IMPRESSION: Patient reports similar NPRS and has suboptimal response to traction/long-leg distraction technique. She does seem to be tolerating repeated flexion well and reports more thigh pain at arrival, though she has experienced pain down LLE intermittently between visits. We utilized DN today to treat taut/tender L lumbar paraspinals and treated multifidus for modulation through  radiculopathic model. Pt has mild post-treatment soreness and has moderate symptoms remaining post-op. Pt does report pain more proximally at arrival, but it is difficult to ascertain if pt is having definitive centralization of L lower quarter symptoms at this time. Pt has remaining impairments in thoracolumbar AROM, quadriceps tightness, taut/tender gluteal mm and deep hip ER, postural changes, and L lumbar flank pain with LLE referral as far as L ankle. Patient will benefit from continued skilled therapeutic intervention to address the above deficits as needed for improved function and QoL.       OBJECTIVE IMPAIRMENTS: Abnormal gait, difficulty walking, decreased ROM, decreased strength, hypomobility, impaired flexibility, postural dysfunction, and pain.    ACTIVITY LIMITATIONS: lifting, sitting, standing, transfers, and locomotion level   PARTICIPATION LIMITATIONS: shopping, community activity, and outdoor walking   PERSONAL FACTORS: Past/current experiences, Time since onset of injury/illness/exacerbation, and 3+ comorbidities: (Hx of MI, HLD, HTN, seizure disorder)  are also affecting patient's functional outcome.    REHAB POTENTIAL: Good   CLINICAL DECISION MAKING: Evolving/moderate complexity   EVALUATION COMPLEXITY: Moderate     GOALS: Goals reviewed with patient? No   SHORT TERM GOALS: Target date: 08/20/2022  Pt will be independent with HEP in order to improve strength and decrease back pain to improve pain-free function at home and work. Baseline: 07/28/22: Baseline HEP initiated with focus on repeated flexion and piriformis stretching.  Goal status: INITIAL     LONG TERM GOALS: Target date: 09/10/2022   Pt will increase FOTO to at least 67 to demonstrate significant improvement in function at home and work related to back pain  Baseline: 07/28/22: 63 Goal status: INITIAL   2.  Pt will decrease worst back pain by at least 2 points on the NPRS in order to demonstrate  clinically significant reduction in back pain. Baseline: 07/28/22: 10/10 at worst  Goal status: INITIAL   3.  Patient will have full thoracolumbar AROM without reproduction of pain as needed for reaching items on ground, household chores, bending.    Baseline: 07/28/22: pain with R sidebend/rotation, pain and motion loss with flexion and extension.  Goal status: INITIAL   4.  Pt will resume outdoor walking as desired without reproduction of pain > 0-1/10 as needed for participation in her regular hobby Baseline: 07/28/22: Pt limited in outdoor walking routine Goal status: INITIAL     PLAN: PT FREQUENCY: 1-2x/week   PT DURATION: 6 weeks   PLANNED INTERVENTIONS: Therapeutic exercises, Therapeutic activity, Neuromuscular re-education, Balance training, Gait training, Patient/Family education, Self Care, Joint mobilization, Joint manipulation, Vestibular training, Canalith repositioning, Orthotic/Fit training, DME instructions, Dry Needling, Electrical stimulation, Spinal manipulation, Spinal mobilization, Cryotherapy, Moist heat, Taping, Traction, Ultrasound, Ionotophoresis 4mg /ml Dexamethasone, Manual therapy, and Re-evaluation.   PLAN FOR NEXT SESSION: Assess response to HEP utilize MT and STM for sensitive/tight L paraspinals and glutes, progress with flexion-bias exercise as able. F/u on response to dry needling.     Consuela Mimes, PT, DPT #[id number]  Gertie Exon, PT 08/06/2022, 9:40 AM

## 2022-08-11 ENCOUNTER — Encounter: Payer: Self-pay | Admitting: Physical Therapy

## 2022-08-11 ENCOUNTER — Ambulatory Visit: Payer: 59 | Admitting: Physical Therapy

## 2022-08-11 DIAGNOSIS — G8929 Other chronic pain: Secondary | ICD-10-CM

## 2022-08-11 DIAGNOSIS — R262 Difficulty in walking, not elsewhere classified: Secondary | ICD-10-CM

## 2022-08-11 DIAGNOSIS — M6281 Muscle weakness (generalized): Secondary | ICD-10-CM

## 2022-08-11 DIAGNOSIS — M5442 Lumbago with sciatica, left side: Secondary | ICD-10-CM | POA: Diagnosis not present

## 2022-08-11 NOTE — Therapy (Unsigned)
OUTPATIENT PHYSICAL THERAPY TREATMENT NOTE   Patient Name: Samantha Watson MRN: [id number] DOB:[birth year], 52 y.o., female Today's Date: 08/11/2022   END OF SESSION:   PT End of Session - 08/11/22 0925     Visit Number 4    Date for PT Re-Evaluation 09/08/22    Authorization Type [employer]    Progress Note Due on Visit 10    PT Start Time 0905    PT Stop Time 0945    PT Time Calculation (min) 40 min    Activity Tolerance Patient tolerated treatment well    Behavior During Therapy [facility] for tasks assessed/performed               Past Medical History:  Diagnosis Date   Hyperlipidemia    Hypertension    MI (myocardial infarction) (HCC) 2016   Seizures (HCC)    Past Surgical History:  Procedure Laterality Date   CARDIAC CATHETERIZATION  2016   possible    LEFT HEART CATH AND CORONARY ANGIOGRAPHY Left 04/07/2017   Procedure: LEFT HEART CATH AND CORONARY ANGIOGRAPHY;  Surgeon: Marcina Millard, MD;  Location: [facility] INVASIVE CV LAB;  Service: Cardiovascular;  Laterality: Left;   There are no problems to display for this patient.   PCP: Etheleen Nicks, NP  REFERRING PROVIDER: Gerlene Burdock, NP   REFERRING DIAG:  [phone] (ICD-10-CM) - Lumbago with sciatica, left side  G89.29 (ICD-10-CM) - Other chronic pain  M54.16 (ICD-10-CM) - Radiculopathy, lumbar region  M48.061 (ICD-10-CM) - Spinal stenosis, lumbar region without neurogenic claudication    THERAPY DIAG:  Chronic left-sided low back pain with left-sided sciatica  Difficulty in walking, not elsewhere classified  Muscle weakness (generalized)  Rationale for Evaluation and Treatment Rehabilitation  PERTINENT HISTORY:  Pt is a 52 year old female with primary complaint of low back pain with left-sided pain/sciatica. Atraumatic onset. Pt reports getting up one day and noticing it hurting more without specific injury. Pt reports being unable to complete hobbies (e.g. planting flowers, walking exercise  regimen). Hx of MI in 2016. Pt reports good cardiology follow-ups since then. Pt reports some pain into LLE. Pt reports some AM stiffness. Pt reports pain down to distal shin/distal leg region. Patient reports that medicine recommended by MD helps "a little." Pt feels she is getting adequate sleep each day.    PAIN:    Pain Intensity: Present: 8/10, Best: 6/10, Worst: 10/10 Pain location: L flank and radiating down length of LLE  Pain Quality: burning  Radiating: Yes , down LLE  Numbness/Tingling: Yes Focal Weakness: Yes, sometimes it feels like she may fall Aggravating factors: walking, mild pain with standing, mild pain with sitting (pt alternates between sitting/standing frequently),  Relieving factors: Tylenol Arthritis, pillow behind back in sitting 24-hour pain behavior: variable, sometimes worse in day and sometimes at night  History of prior back injury, pain, surgery, or therapy: No   Imaging: Yes ,   EXAM: Magnetic resonance imaging, spinal canal and contents, lumbar, without contrast material.    Heterogeneous bone marrow signal abnormality likely a sequela of degenerative change. Small benign osseous hemangioma in the L3 vertebral body. The conus medullaris ends at a normal level.  The vertebral bodies are normally aligned. 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 Multilevel disc space narrowing and disc desiccation.  T12-L1: Mild concentric disc bulge with no significant spinal canal or neural foraminal narrowing.  L1-L2: No significant spinal canal or neural foraminal narrowing. There is mild bilateral  facet arthropathy.  L2-L3: Mild concentric disc bulge with no significant spinal canal narrowing. Mild bilateral neuroforaminal narrowing, greater on the left due to asymmetric facet arthropathy.  L3-L4: Moderate spinal canal narrowing due to a combination of concentric disc bulge with superimposed small right  subarticular disc protrusion resulting in narrowing of the right lateral recess and indenting/impinging the traversing right L4 nerve root. Moderate bilateral neuroforaminal narrowing due to prominent facet arthropathy and disc bulging extending into the foraminal zones.  L4-L5: Moderate spinal canal narrowing due to a combination of concentric disc bulge and bilateral facet arthropathy. Disc bulge slightly indents the bilateral traversing L5 nerve roots, greater on the right. There is posterior disc annular fissuring. Moderate to severe bilateral neuroforaminal narrowing, greater on the left.  L5-S1: Mild disc bulging with no significant spinal canal or neural foraminal narrowing. There is bilateral facet arthropathy.        Red flags: Negative for bowel/bladder changes, saddle paresthesia, personal history of cancer, h/o spinal tumors, h/o compression fx, h/o abdominal aneurysm, abdominal pain, chills/fever, night sweats, nausea, vomiting, unrelenting pain, first onset of insidious LBP <20 y/o     PRECAUTIONS: None   WEIGHT BEARING RESTRICTIONS: No   FALLS: Has patient fallen in last 6 months? No   Living Environment Lives with:  pt lives beside her father and her stepmom Lives in: House/apartment     Prior level of function: Independent   Occupational demands: -   Hobbies: Gardening, planting flowers, outdoor walking   Patient Goals: Improved pain, return to hobbies      PRECAUTIONS: PCN allergy   SUBJECTIVE:                                                                                                                                                                                      SUBJECTIVE STATEMENT:  Patient reports feeling "about the same" at arrival to PT. Patient reports feeling somewhat better than she was following DN. Patient reports pain in low back and into her L leg, same distribution down to L thigh and L knee/proximal shin. Patient reports compliance with  her HEP.    PAIN:  Are you having pain? 6-7/10 pain in L flank and down to L knee     OBJECTIVE: (objective measures completed at initial evaluation unless otherwise dated)   Patient Surveys  FOTO 63, predicted outcome score of 67         GAIT: Distance walked: 60 ft Assistive device utilized: None Level of assistance: Complete Independence Comments: Antalgic pattern with dec stance time on L side, decreased arm swing/trunk rotation   Posture: Guarded posture of thoracolumbar spine Lumbar lordosis: WNL  Lumbar lateral shift: Negative   AROM     AROM (Normal range in degrees) AROM   Lumbar    Flexion (65) 75%*  Extension (30) 75%*  Right lateral flexion (25) 100% (pull on L flank)  Left lateral flexion (25) 100%  Right rotation (30) 100%*  Left rotation (30) 100%         Hip Right Left  Flexion (125)      Extension (15)      Abduction (40)      Adduction       Internal Rotation (45)      External Rotation (45)             (* = pain; Blank rows = not tested)     LE MMT: MMT (out of 5) Right   Left    Hip flexion 4-* 4-*  Hip extension      Hip abduction (seated) 4 4  Hip adduction 5 5  Hip internal rotation      Hip external rotation      Knee flexion      Knee extension 4+ 4  Ankle dorsiflexion 4+ 4+  Ankle plantarflexion      Ankle inversion      Ankle eversion      (* = pain; Blank rows = not tested)   Sensation Grossly intact to light touch throughout bilateral LEs as determined by testing dermatomes L2-S2. Proprioception, stereognosis, and hot/cold testing deferred on this date.   Reflexes R/L Knee Jerk (L3/4): 3+/3+  Ankle Jerk (S1/2): 3+/3+  [Pt is generally hyperreflexic]   Muscle Length Hamstrings: R: Negative L: Negative E[place] (quadriceps): R: Positive L: Positive     Palpation Location Right Left         Lumbar paraspinals 0 2  Quadratus Lumborum 0 0  Gluteus Maximus 0 2  Gluteus Medius 0 2  Deep hip external rotators 0 2   PSIS 1 1  Fortin's Area (SIJ)      Greater Trochanter      (Blank rows = not tested) Graded on 0-4 scale (0 = no pain, 1 = pain, 2 = pain with wincing/grimacing/flinching, 3 = pain with withdrawal, 4 = unwilling to allow palpation)   Passive Accessory Intervertebral Motion Reproduction of back pain with CPA L3-S1, empty end-feel   Special Tests Lumbar Radiculopathy and Discogenic: Centralization and Peripheralization (SN 92, -LR 0.12): Minimal c/o pain in lying following repeated flexion in lying (double knee to chest) Slump (SN 83, -LR 0.32): R: Negative L: Negative SLR (SN 92, -LR 0.29): R: Negative L:  Negative   Clonus: 2-3 beats RLE, 3-4 beats LLE Hoffman's Sign: R Negative L Negative Babinski: R Negative, L Negative    Lumbar spine compression: Positive for pain provocation Lumbar spine general distraction in hooklying: Negative for symptom relief    Facet Joint: Extension-Rotation (SN 100, -LR 0.0): R: Negative (pain on L with provocative position), L: Positive   Lumbar Foraminal Stenosis: Lumbar quadrant (SN 70): R: Negative L: Positive    Piriformis Syndrome: PACE Sign: Mild positive L       TODAY'S TREATMENT:     Manual Therapy - for symptom modulation, soft tissue sensitivity and mobility, joint mobility, ROM   *not today* STM and IASTM with Hypervolt along bilat erector spine L3-5; x 15 minutes LLE long-leg distraction with pt in hooklying on table; 10 sec on, 5 sec off; x 1 minute  -discomfort in L knee with full extension position  Therapeutic Exercise - for improved soft tissue flexibility and extensibility as needed for ROM, MDT/repeated movement to modulate pain and improve ROM   Repeated flexion in lying (double knee to chest); 1x10; Repeated flexion in lying (double knee to chest); 1x10, clinician overpressure;   -no effect,  Lower trunk rotation; x10 alternating R/L Quadruped sidebend; x10 Posterior pelvic tilt; 2x10 Open book; 1x10  lying on R and L side  Swiss ball posterior pelvic tilt, seated on Green ball; 20x Swiss's ball march, seated on Green swiss ball; x15 alternating R/L   AROM Lumbar flexion 100% (mild pain) Lumbar extension 75%* (pain in small of back)  Lateral flexion: R 100%, L 100%* Thoracolumbar rotation: R 100%, L 100%   PATIENT EDUCATION: Encouraged continued HEP, and re-educated on swelling management for her L knee. HEP updated and new handout provided   *not today* SLR; 2x10 with each LE Piriformis stretch, figure-4 pull; 2 x 30 sec       PATIENT EDUCATION:  Education details: see above for patient education details Person educated: Patient Education method: Explanation, Demonstration, and Handouts Education comprehension: verbalized understanding and returned demonstration     HOME EXERCISE PROGRAM:  Access Code: [email or web address] URL: [email or web address] Date: 08/06/2022 Prepared by: Consuela Mimes  Exercises - Supine Double Knee to Chest  - 5-6 x daily - 7 x weekly - 1 sets - 10 reps - 1sec hold - Supine Piriformis Stretch with Foot on Ground  - 2 x daily - 7 x weekly - 3 sets - 30sec hold - Lower Trunk Rotations  - 2 x daily - 7 x weekly - 2 sets - 10 reps - Active Straight Leg Raise with Quad Set  - 1 x daily - 7 x weekly - 2 sets - 10 reps      ASSESSMENT:   CLINICAL IMPRESSION: Patient reports similar NPRS and has suboptimal response to traction/long-leg distraction technique. She does seem to be tolerating repeated flexion well and reports more thigh pain at arrival, though she has experienced pain down LLE intermittently between visits. We utilized DN today to treat taut/tender L lumbar paraspinals and treated multifidus for modulation through radiculopathic model. Pt has mild post-treatment soreness and has moderate symptoms remaining post-op. Pt does report pain more proximally at arrival, but it is difficult to ascertain if pt is having definitive  centralization of L lower quarter symptoms at this time. Pt has remaining impairments in thoracolumbar AROM, quadriceps tightness, taut/tender gluteal mm and deep hip ER, postural changes, and L lumbar flank pain with LLE referral as far as L ankle. Patient will benefit from continued skilled therapeutic intervention to address the above deficits as needed for improved function and QoL.       OBJECTIVE IMPAIRMENTS: Abnormal gait, difficulty walking, decreased ROM, decreased strength, hypomobility, impaired flexibility, postural dysfunction, and pain.    ACTIVITY LIMITATIONS: lifting, sitting, standing, transfers, and locomotion level   PARTICIPATION LIMITATIONS: shopping, community activity, and outdoor walking   PERSONAL FACTORS: Past/current experiences, Time since onset of injury/illness/exacerbation, and 3+ comorbidities: (Hx of MI, HLD, HTN, seizure disorder)  are also affecting patient's functional outcome.    REHAB POTENTIAL: Good   CLINICAL DECISION MAKING: Evolving/moderate complexity   EVALUATION COMPLEXITY: Moderate     GOALS: Goals reviewed with patient? No   SHORT TERM GOALS: Target date: 08/20/2022   Pt will be independent with HEP in order to improve strength and decrease back pain to improve pain-free function at home and work. Baseline: 07/28/22: Baseline HEP  initiated with focus on repeated flexion and piriformis stretching.  Goal status: INITIAL     LONG TERM GOALS: Target date: 09/10/2022   Pt will increase FOTO to at least 67 to demonstrate significant improvement in function at home and work related to back pain  Baseline: 07/28/22: 63 Goal status: INITIAL   2.  Pt will decrease worst back pain by at least 2 points on the NPRS in order to demonstrate clinically significant reduction in back pain. Baseline: 07/28/22: 10/10 at worst  Goal status: INITIAL   3.  Patient will have full thoracolumbar AROM without reproduction of pain as needed for reaching items on  ground, household chores, bending.    Baseline: 07/28/22: pain with R sidebend/rotation, pain and motion loss with flexion and extension.  Goal status: INITIAL   4.  Pt will resume outdoor walking as desired without reproduction of pain > 0-1/10 as needed for participation in her regular hobby Baseline: 07/28/22: Pt limited in outdoor walking routine Goal status: INITIAL     PLAN: PT FREQUENCY: 1-2x/week   PT DURATION: 6 weeks   PLANNED INTERVENTIONS: Therapeutic exercises, Therapeutic activity, Neuromuscular re-education, Balance training, Gait training, Patient/Family education, Self Care, Joint mobilization, Joint manipulation, Vestibular training, Canalith repositioning, Orthotic/Fit training, DME instructions, Dry Needling, Electrical stimulation, Spinal manipulation, Spinal mobilization, Cryotherapy, Moist heat, Taping, Traction, Ultrasound, Ionotophoresis 4mg /ml Dexamethasone, Manual therapy, and Re-evaluation.   PLAN FOR NEXT SESSION: Assess response to HEP utilize MT and STM for sensitive/tight L paraspinals and glutes, progress with flexion-bias exercise as able. F/u on response to dry needling.     Consuela Mimes, PT, DPT #[id number]  Gertie Exon, PT 08/11/2022, 9:27 AM

## 2022-08-13 ENCOUNTER — Ambulatory Visit: Payer: 59 | Admitting: Physical Therapy

## 2022-08-13 ENCOUNTER — Encounter: Payer: Self-pay | Admitting: Physical Therapy

## 2022-08-13 DIAGNOSIS — M6281 Muscle weakness (generalized): Secondary | ICD-10-CM

## 2022-08-13 DIAGNOSIS — M5442 Lumbago with sciatica, left side: Secondary | ICD-10-CM | POA: Diagnosis not present

## 2022-08-13 DIAGNOSIS — R262 Difficulty in walking, not elsewhere classified: Secondary | ICD-10-CM

## 2022-08-13 DIAGNOSIS — G8929 Other chronic pain: Secondary | ICD-10-CM

## 2022-08-13 NOTE — Therapy (Signed)
OUTPATIENT PHYSICAL THERAPY TREATMENT NOTE   Patient Name: Samantha Watson MRN: [id number] DOB:Feb 03, 1971, 52 y.o., female Today's Date: 08/13/2022   END OF SESSION:   PT End of Session - 08/13/22 0852     Visit Number 5    Date for PT Re-Evaluation 09/08/22    Authorization Type [employer]    Progress Note Due on Visit 10    PT Start Time 0851    PT Stop Time 0928    PT Time Calculation (min) 37 min    Activity Tolerance Patient tolerated treatment well    Behavior During Therapy [facility] for tasks assessed/performed                Past Medical History:  Diagnosis Date   Hyperlipidemia    Hypertension    MI (myocardial infarction) (HCC) 2016   Seizures (HCC)    Past Surgical History:  Procedure Laterality Date   CARDIAC CATHETERIZATION  2016   possible    LEFT HEART CATH AND CORONARY ANGIOGRAPHY Left 04/07/2017   Procedure: LEFT HEART CATH AND CORONARY ANGIOGRAPHY;  Surgeon: Marcina Millard, MD;  Location: [facility];  Service: Cardiovascular;  Laterality: Left;   There are no problems to display for this patient.   PCP: Etheleen Nicks, NP  REFERRING PROVIDER: Gerlene Burdock, NP   REFERRING DIAG:  [phone] (ICD-10-CM) - Lumbago with sciatica, left side  G89.29 (ICD-10-CM) - Other chronic pain  M54.16 (ICD-10-CM) - Radiculopathy, lumbar region  M48.061 (ICD-10-CM) - Spinal stenosis, lumbar region without neurogenic claudication    THERAPY DIAG:  Chronic left-sided low back pain with left-sided sciatica  Difficulty in walking, not elsewhere classified  Muscle weakness (generalized)  Rationale for Evaluation and Treatment Rehabilitation  PERTINENT HISTORY:  Pt is a 52 year old female with primary complaint of low back pain with left-sided pain/sciatica. Atraumatic onset. Pt reports getting up one day and noticing it hurting more without specific injury. Pt reports being unable to complete hobbies (e.g. planting flowers, walking exercise  regimen). Hx of MI in 2016. Pt reports good cardiology follow-ups since then. Pt reports some pain into LLE. Pt reports some AM stiffness. Pt reports pain down to distal shin/distal leg region. Patient reports that medicine recommended by MD helps "a little." Pt feels she is getting adequate sleep each day.    PAIN:    Pain Intensity: Present: 8/10, Best: 6/10, Worst: 10/10 Pain location: L flank and radiating down length of LLE  Pain Quality: burning  Radiating: Yes , down LLE  Numbness/Tingling: Yes Focal Weakness: Yes, sometimes it feels like she may fall Aggravating factors: walking, mild pain with standing, mild pain with sitting (pt alternates between sitting/standing frequently),  Relieving factors: Tylenol Arthritis, pillow behind back in sitting 24-hour pain behavior: variable, sometimes worse in day and sometimes at night  History of prior back injury, pain, surgery, or therapy: No   Imaging: Yes ,   EXAM: Magnetic resonance imaging, spinal canal and contents, lumbar, without contrast material.    Heterogeneous bone marrow signal abnormality likely a sequela of degenerative change. Small benign osseous hemangioma in the L3 vertebral body. The conus medullaris ends at a normal level.  The vertebral bodies are normally aligned. 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 Multilevel disc space narrowing and disc desiccation.  T12-L1: Mild concentric disc bulge with no significant spinal canal or neural foraminal narrowing.  L1-L2: No significant spinal canal or neural foraminal narrowing. There is mild  bilateral facet arthropathy.  L2-L3: Mild concentric disc bulge with no significant spinal canal narrowing. Mild bilateral neuroforaminal narrowing, greater on the left due to asymmetric facet arthropathy.  L3-L4: Moderate spinal canal narrowing due to a combination of concentric disc bulge with superimposed small right  subarticular disc protrusion resulting in narrowing of the right lateral recess and indenting/impinging the traversing right L4 nerve root. Moderate bilateral neuroforaminal narrowing due to prominent facet arthropathy and disc bulging extending into the foraminal zones.  L4-L5: Moderate spinal canal narrowing due to a combination of concentric disc bulge and bilateral facet arthropathy. Disc bulge slightly indents the bilateral traversing L5 nerve roots, greater on the right. There is posterior disc annular fissuring. Moderate to severe bilateral neuroforaminal narrowing, greater on the left.  L5-S1: Mild disc bulging with no significant spinal canal or neural foraminal narrowing. There is bilateral facet arthropathy.        Red flags: Negative for bowel/bladder changes, saddle paresthesia, personal history of cancer, h/o spinal tumors, h/o compression fx, h/o abdominal aneurysm, abdominal pain, chills/fever, night sweats, nausea, vomiting, unrelenting pain, first onset of insidious LBP <20 y/o     PRECAUTIONS: None   WEIGHT BEARING RESTRICTIONS: No   FALLS: Has patient fallen in last 6 months? No   Living Environment Lives with:  pt lives beside her father and her stepmom Lives in: House/apartment     Prior level of function: Independent   Occupational demands: -   Hobbies: Gardening, planting flowers, outdoor walking   Patient Goals: Improved pain, return to hobbies      PRECAUTIONS: PCN allergy   SUBJECTIVE:                                                                                                                                                                                      SUBJECTIVE STATEMENT:  Patient reports feeling "about the same" at arrival and reports 8/10 pain at arrival to clinic. She reports completing knee to chest every day 1x/day.    PAIN:  Are you having pain? 8/10 pain in L flank and down to L knee     OBJECTIVE: (objective measures  completed at initial evaluation unless otherwise dated)   Patient Surveys  FOTO 63, predicted outcome score of 67         GAIT: Distance walked: 60 ft Assistive device utilized: None Level of assistance: Complete Independence Comments: Antalgic pattern with dec stance time on L side, decreased arm swing/trunk rotation   Posture: Guarded posture of thoracolumbar spine Lumbar lordosis: WNL Lumbar lateral shift: Negative   AROM     AROM (Normal range in degrees) AROM   Lumbar  Flexion (65) 75%*  Extension (30) 75%*  Right lateral flexion (25) 100% (pull on L flank)  Left lateral flexion (25) 100%  Right rotation (30) 100%*  Left rotation (30) 100%         Hip Right Left  Flexion (125)      Extension (15)      Abduction (40)      Adduction       Internal Rotation (45)      External Rotation (45)             (* = pain; Blank rows = not tested)     LE MMT: MMT (out of 5) Right   Left    Hip flexion 4-* 4-*  Hip extension      Hip abduction (seated) 4 4  Hip adduction 5 5  Hip internal rotation      Hip external rotation      Knee flexion      Knee extension 4+ 4  Ankle dorsiflexion 4+ 4+  Ankle plantarflexion      Ankle inversion      Ankle eversion      (* = pain; Blank rows = not tested)   Sensation Grossly intact to light touch throughout bilateral LEs as determined by testing dermatomes L2-S2. Proprioception, stereognosis, and hot/cold testing deferred on this date.   Reflexes R/L Knee Jerk (L3/4): 3+/3+  Ankle Jerk (S1/2): 3+/3+  [Pt is generally hyperreflexic]   Muscle Length Hamstrings: R: Negative L: Negative [place] (quadriceps): R: Positive L: Positive     Palpation Location Right Left         Lumbar paraspinals 0 2  Quadratus Lumborum 0 0  Gluteus Maximus 0 2  Gluteus Medius 0 2  Deep hip external rotators 0 2  PSIS 1 1  Fortin's Area (SIJ)      Greater Trochanter      (Blank rows = not tested) Graded on 0-4 scale (0 = no pain, 1 =  pain, 2 = pain with wincing/grimacing/flinching, 3 = pain with withdrawal, 4 = unwilling to allow palpation)   Passive Accessory Intervertebral Motion Reproduction of back pain with CPA L3-S1, empty end-feel   Special Tests Lumbar Radiculopathy and Discogenic: Centralization and Peripheralization (SN 92, -LR 0.12): Minimal c/o pain in lying following repeated flexion in lying (double knee to chest) Slump (SN 83, -LR 0.32): R: Negative L: Negative SLR (SN 92, -LR 0.29): R: Negative L:  Negative   Clonus: 2-3 beats RLE, 3-4 beats LLE Hoffman's Sign: R Negative L Negative Babinski: R Negative, L Negative    Lumbar spine compression: Positive for pain provocation Lumbar spine general distraction in hooklying: Negative for symptom relief    Facet Joint: Extension-Rotation (SN 100, -LR 0.0): R: Negative (pain on L with provocative position), L: Positive   Lumbar Foraminal Stenosis: Lumbar quadrant (SN 70): R: Negative L: Positive    Piriformis Syndrome: PACE Sign: Mild positive L       TODAY'S TREATMENT:     Manual Therapy - for symptom modulation, soft tissue sensitivity and mobility, joint mobility, ROM   *not today* STM and IASTM with Hypervolt along bilat erector spine L3-5; x 15 minutes LLE long-leg distraction with pt in hooklying on table; 10 sec on, 5 sec off; x 1 minute  -discomfort in L knee with full extension position     Therapeutic Exercise - for improved soft tissue flexibility and extensibility as needed for ROM, MDT/repeated movement to modulate pain and  improve ROM   AROM Lumbar flexion 100%* (pain L knee)  Lumbar extension 75%* (pain in small of back)  Lateral flexion: R 100%*, L 100%* Thoracolumbar rotation: R 100%, L 100%   Repeated extension in lying; 2x10, 1 sec   -no effect during, no pain after  -lateral flexion and extension AROM is improved  Anterior pelvic tilt; 2x10 Bridge with neutral lumbar spine; 2x10  Lower trunk rotation; x10  alternating R/L  Swiss's ball march, seated on Green swiss ball; x20 alternating R/L Wall ball partial squat, Green swiss ball; 2x10    PATIENT EDUCATION: Discussed modification to HEP and change in repeated movement strategy given difficulty obtaining progress to date. We discussed pertinent activity modification and provided new HEP printout.     *not today* Open book; 1x10 lying on R and L side Quadruped sidebend; x10 alternating R/L Repeated flexion in lying (double knee to chest); 1x10; Repeated flexion in lying (double knee to chest); 1x10, clinician overpressure;  SLR; 2x10 with each LE Piriformis stretch, figure-4 pull; 2 x 30 sec       PATIENT EDUCATION:  Education details: see above for patient education details Person educated: Patient Education method: Explanation, Demonstration, and Handouts Education comprehension: verbalized understanding and returned demonstration     HOME EXERCISE PROGRAM:  Access Code: [email or web address] URL: [email or web address] Date: 08/13/2022 Prepared by: Consuela Mimes  Exercises - Prone Press Up  - 5-6 x daily - 7 x weekly - 1 sets - 10 reps - 1sec hold - Supine Piriformis Stretch with Foot on Ground  - 2 x daily - 7 x weekly - 3 sets - 30sec hold - Lower Trunk Rotations  - 2 x daily - 7 x weekly - 2 sets - 10 reps - Active Straight Leg Raise with Quad Set  - 1 x daily - 7 x weekly - 2 sets - 10 reps      ASSESSMENT:   CLINICAL IMPRESSION: Patient reports ongoing limited frequency for repeated movements at home and reports slightly higher NPRS. Decision-making for intervention is notably confounded by limited compliance with repeated movement program outside of clinic. We did check response to extension today with pt exhibiting improved pain-free thoracolumbar AROM and no significant pain after completion of repeated extension in lying. We discussed using this at home to trial extension program versus flexion given optimal  response noted today. Pt tolerates lumbopelvic ROM and posterior chain strengthening drills well today. Pt has remaining impairments in thoracolumbar AROM, quadriceps tightness, taut/tender gluteal mm and deep hip ER, postural changes, and L lumbar flank pain with LLE referral as far as L ankle. Patient will benefit from continued skilled therapeutic intervention to address the above deficits as needed for improved function and QoL.       OBJECTIVE IMPAIRMENTS: Abnormal gait, difficulty walking, decreased ROM, decreased strength, hypomobility, impaired flexibility, postural dysfunction, and pain.    ACTIVITY LIMITATIONS: lifting, sitting, standing, transfers, and locomotion level   PARTICIPATION LIMITATIONS: shopping, community activity, and outdoor walking   PERSONAL FACTORS: Past/current experiences, Time since onset of injury/illness/exacerbation, and 3+ comorbidities: (Hx of MI, HLD, HTN, seizure disorder)  are also affecting patient's functional outcome.    REHAB POTENTIAL: Good   CLINICAL DECISION MAKING: Evolving/moderate complexity   EVALUATION COMPLEXITY: Moderate     GOALS: Goals reviewed with patient? No   SHORT TERM GOALS: Target date: 08/20/2022    Pt will be independent with HEP in order to improve strength and decrease back pain to improve pain-free function  at home and work. Baseline: 07/28/22: Baseline HEP initiated with focus on repeated flexion and piriformis stretching.  Goal status: INITIAL     LONG TERM GOALS: Target date: 09/10/2022   Pt will increase FOTO to at least 67 to demonstrate significant improvement in function at home and work related to back pain  Baseline: 07/28/22: 63 Goal status: INITIAL   2.  Pt will decrease worst back pain by at least 2 points on the NPRS in order to demonstrate clinically significant reduction in back pain. Baseline: 07/28/22: 10/10 at worst  Goal status: INITIAL   3.  Patient will have full thoracolumbar AROM without  reproduction of pain as needed for reaching items on ground, household chores, bending.    Baseline: 07/28/22: pain with R sidebend/rotation, pain and motion loss with flexion and extension.  Goal status: INITIAL   4.  Pt will resume outdoor walking as desired without reproduction of pain > 0-1/10 as needed for participation in her regular hobby Baseline: 07/28/22: Pt limited in outdoor walking routine Goal status: INITIAL     PLAN: PT FREQUENCY: 1-2x/week   PT DURATION: 6 weeks   PLANNED INTERVENTIONS: Therapeutic exercises, Therapeutic activity, Neuromuscular re-education, Balance training, Gait training, Patient/Family education, Self Care, Joint mobilization, Joint manipulation, Vestibular training, Canalith repositioning, Orthotic/Fit training, DME instructions, Dry Needling, Electrical stimulation, Spinal manipulation, Spinal mobilization, Cryotherapy, Moist heat, Taping, Traction, Ultrasound, Ionotophoresis 4mg /ml Dexamethasone, Manual therapy, and Re-evaluation.   PLAN FOR NEXT SESSION:  Assess response with repeated extension, use MT for symptom modulation as needed and progress with posterior chain strengthening and graded movement as tolerated.     Consuela Mimes, PT, DPT #[id number]  Gertie Exon, PT 08/13/2022, 9:28 AM

## 2022-08-18 ENCOUNTER — Ambulatory Visit: Payer: 59 | Attending: Orthopedic | Admitting: Physical Therapy

## 2022-08-18 DIAGNOSIS — M5442 Lumbago with sciatica, left side: Secondary | ICD-10-CM | POA: Insufficient documentation

## 2022-08-18 DIAGNOSIS — R262 Difficulty in walking, not elsewhere classified: Secondary | ICD-10-CM | POA: Insufficient documentation

## 2022-08-18 DIAGNOSIS — M6281 Muscle weakness (generalized): Secondary | ICD-10-CM | POA: Diagnosis present

## 2022-08-18 DIAGNOSIS — G8929 Other chronic pain: Secondary | ICD-10-CM | POA: Diagnosis present

## 2022-08-18 NOTE — Therapy (Signed)
OUTPATIENT PHYSICAL THERAPY TREATMENT NOTE   Patient Name: Samantha Watson MRN: [id number] DOB:28-Nov-1970, 52 y.o., female Today's Date: 08/18/2022   END OF SESSION:   PT End of Session - 08/18/22 0808     Visit Number 6    Date for PT Re-Evaluation 09/08/22    Authorization Type [employer]    Progress Note Due on Visit 10    PT Start Time 0900    PT Stop Time 0942    PT Time Calculation (min) 42 min    Activity Tolerance Patient tolerated treatment well    Behavior During Therapy [facility] for tasks assessed/performed             Past Medical History:  Diagnosis Date   Hyperlipidemia    Hypertension    MI (myocardial infarction) (HCC) 2016   Seizures (HCC)    Past Surgical History:  Procedure Laterality Date   CARDIAC CATHETERIZATION  2016   possible    LEFT HEART CATH AND CORONARY ANGIOGRAPHY Left 04/07/2017   Procedure: LEFT HEART CATH AND CORONARY ANGIOGRAPHY;  Surgeon: Marcina Millard, MD;  Location: [facility] IN[facility];  Service: Cardiovascular;  Laterality: Left;   There are no problems to display for this patient.   PCP: Etheleen Nicks, NP  REFERRING PROVIDER: Gerlene Burdock, NP   REFERRING DIAG:  [phone] (ICD-10-CM) - Lumbago with sciatica, left side  G89.29 (ICD-10-CM) - Other chronic pain  M54.16 (ICD-10-CM) - Radiculopathy, lumbar region  M48.061 (ICD-10-CM) - Spinal stenosis, lumbar region without neurogenic claudication    THERAPY DIAG:  Chronic left-sided low back pain with left-sided sciatica  Difficulty in walking, not elsewhere classified  Muscle weakness (generalized)  Rationale for Evaluation and Treatment Rehabilitation  PERTINENT HISTORY:  Pt is a 52 year old female with primary complaint of low back pain with left-sided pain/sciatica. Atraumatic onset. Pt reports getting up one day and noticing it hurting more without specific injury. Pt reports being unable to complete hobbies (e.g. planting flowers, walking exercise regimen). Hx  of MI in 2016. Pt reports good cardiology follow-ups since then. Pt reports some pain into LLE. Pt reports some AM stiffness. Pt reports pain down to distal shin/distal leg region. Patient reports that medicine recommended by MD helps "a little." Pt feels she is getting adequate sleep each day.    PAIN:    Pain Intensity: Present: 8/10, Best: 6/10, Worst: 10/10 Pain location: L flank and radiating down length of LLE  Pain Quality: burning  Radiating: Yes , down LLE  Numbness/Tingling: Yes Focal Weakness: Yes, sometimes it feels like she may fall Aggravating factors: walking, mild pain with standing, mild pain with sitting (pt alternates between sitting/standing frequently),  Relieving factors: Tylenol Arthritis, pillow behind back in sitting 24-hour pain behavior: variable, sometimes worse in day and sometimes at night  History of prior back injury, pain, surgery, or therapy: No   Imaging: Yes ,   EXAM: Magnetic resonance imaging, spinal canal and contents, lumbar, without contrast material.    Heterogeneous bone marrow signal abnormality likely a sequela of degenerative change. Small benign osseous hemangioma in the L3 vertebral body. The conus medullaris ends at a normal level.  The vertebral bodies are normally aligned. 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 Multilevel disc space narrowing and disc desiccation.  T12-L1: Mild concentric disc bulge with no significant spinal canal or neural foraminal narrowing.  L1-L2: No significant spinal canal or neural foraminal narrowing. There is mild bilateral facet arthropathy.  L2-L3: Mild concentric disc bulge with no significant spinal canal narrowing. Mild bilateral neuroforaminal narrowing, greater on the left due to asymmetric facet arthropathy.  L3-L4: Moderate spinal canal narrowing due to a combination of concentric disc bulge with superimposed small right subarticular disc  protrusion resulting in narrowing of the right lateral recess and indenting/impinging the traversing right L4 nerve root. Moderate bilateral neuroforaminal narrowing due to prominent facet arthropathy and disc bulging extending into the foraminal zones.  L4-L5: Moderate spinal canal narrowing due to a combination of concentric disc bulge and bilateral facet arthropathy. Disc bulge slightly indents the bilateral traversing L5 nerve roots, greater on the right. There is posterior disc annular fissuring. Moderate to severe bilateral neuroforaminal narrowing, greater on the left.  L5-S1: Mild disc bulging with no significant spinal canal or neural foraminal narrowing. There is bilateral facet arthropathy.        Red flags: Negative for bowel/bladder changes, saddle paresthesia, personal history of cancer, h/o spinal tumors, h/o compression fx, h/o abdominal aneurysm, abdominal pain, chills/fever, night sweats, nausea, vomiting, unrelenting pain, first onset of insidious LBP <20 y/o     PRECAUTIONS: None   WEIGHT BEARING RESTRICTIONS: No   FALLS: Has patient fallen in last 6 months? No   Living Environment Lives with:  pt lives beside her father and her stepmom Lives in: House/apartment     Prior level of function: Independent   Occupational demands: -   Hobbies: Gardening, planting flowers, outdoor walking   Patient Goals: Improved pain, return to hobbies      PRECAUTIONS: PCN allergy   SUBJECTIVE:                                                                                                                                                                                      SUBJECTIVE STATEMENT:  Patient reports tolerating repeated extension well over the weekend. Patient reports notable stiffness this AM. Patient reports 1x/day for her repeated movement program. Patient reports no other major changes recently.    PAIN:  Are you having pain? 7/10 pain affecting L side of back  and her L thigh     OBJECTIVE: (objective measures completed at initial evaluation unless otherwise dated)   Patient Surveys  FOTO 63, predicted outcome score of 67         GAIT: Distance walked: 60 ft Assistive device utilized: None Level of assistance: Complete Independence Comments: Antalgic pattern with dec stance time on L side, decreased arm swing/trunk rotation   Posture: Guarded posture of thoracolumbar spine Lumbar lordosis: WNL Lumbar lateral shift: Negative   AROM     AROM (Normal range in degrees) AROM  Lumbar    Flexion (65) 75%*  Extension (30) 75%*  Right lateral flexion (25) 100% (pull on L flank)  Left lateral flexion (25) 100%  Right rotation (30) 100%*  Left rotation (30) 100%         Hip Right Left  Flexion (125)      Extension (15)      Abduction (40)      Adduction       Internal Rotation (45)      External Rotation (45)             (* = pain; Blank rows = not tested)     LE MMT: MMT (out of 5) Right   Left    Hip flexion 4-* 4-*  Hip extension      Hip abduction (seated) 4 4  Hip adduction 5 5  Hip internal rotation      Hip external rotation      Knee flexion      Knee extension 4+ 4  Ankle dorsiflexion 4+ 4+  Ankle plantarflexion      Ankle inversion      Ankle eversion      (* = pain; Blank rows = not tested)   Sensation Grossly intact to light touch throughout bilateral LEs as determined by testing dermatomes L2-S2. Proprioception, stereognosis, and hot/cold testing deferred on this date.   Reflexes R/L Knee Jerk (L3/4): 3+/3+  Ankle Jerk (S1/2): 3+/3+  [Pt is generally hyperreflexic]   Muscle Length Hamstrings: R: Negative L: Negative E[place] (quadriceps): R: Positive L: Positive     Palpation Location Right Left         Lumbar paraspinals 0 2  Quadratus Lumborum 0 0  Gluteus Maximus 0 2  Gluteus Medius 0 2  Deep hip external rotators 0 2  PSIS 1 1  Fortin's Area (SIJ)      Greater Trochanter      (Blank  rows = not tested) Graded on 0-4 scale (0 = no pain, 1 = pain, 2 = pain with wincing/grimacing/flinching, 3 = pain with withdrawal, 4 = unwilling to allow palpation)   Passive Accessory Intervertebral Motion Reproduction of back pain with CPA L3-S1, empty end-feel   Special Tests Lumbar Radiculopathy and Discogenic: Centralization and Peripheralization (SN 92, -LR 0.12): Minimal c/o pain in lying following repeated flexion in lying (double knee to chest) Slump (SN 83, -LR 0.32): R: Negative L: Negative SLR (SN 92, -LR 0.29): R: Negative L:  Negative   Clonus: 2-3 beats RLE, 3-4 beats LLE Hoffman's Sign: R Negative L Negative Babinski: R Negative, L Negative    Lumbar spine compression: Positive for pain provocation Lumbar spine general distraction in hooklying: Negative for symptom relief    Facet Joint: Extension-Rotation (SN 100, -LR 0.0): R: Negative (pain on L with provocative position), L: Positive   Lumbar Foraminal Stenosis: Lumbar quadrant (SN 70): R: Negative L: Positive    Piriformis Syndrome: PACE Sign: Mild positive L       TODAY'S TREATMENT:     Manual Therapy - for symptom modulation, soft tissue sensitivity and mobility, joint mobility, ROM   *not today* STM and IASTM with Hypervolt along bilat erector spine L3-5; x 15 minutes LLE long-leg distraction with pt in hooklying on table; 10 sec on, 5 sec off; x 1 minute  -discomfort in L knee with full extension position     Therapeutic Exercise - for improved soft tissue flexibility and extensibility as needed for ROM, MDT/repeated movement  to modulate pain and improve ROM   AROM Lumbar flexion 100%* (pain back and L leg)  Lumbar extension 100%* (mild pain, "not much") Lateral flexion: R 100%, L 100% Thoracolumbar rotation: R 100%, L 100%   Repeated extension in lying; 2x10, 1 sec   -no effect during, no pain after  -lateral flexion and extension AROM is improved  Anterior pelvic tilt; 2x10 Bridge  with neutral lumbar spine; 2x10   Bird dog; 1x10 alternating R/L   Swiss's ball march, seated on Green swiss ball; 2x10 alternating R/L  Wall ball partial squat, Green swiss ball; 2x10    PATIENT EDUCATION: Discussed importance of regular consistent HEP frequency and for regular repeated movement to get best benefit of MDT program.     *not today* Lower trunk rotation; x10 alternating R/L Open book; 1x10 lying on R and L side Quadruped sidebend; x10 alternating R/L Repeated flexion in lying (double knee to chest); 1x10; Repeated flexion in lying (double knee to chest); 1x10, clinician overpressure;  SLR; 2x10 with each LE Piriformis stretch, figure-4 pull; 2 x 30 sec       PATIENT EDUCATION:  Education details: see above for patient education details Person educated: Patient Education method: Explanation, Demonstration, and Handouts Education comprehension: verbalized understanding and returned demonstration     HOME EXERCISE PROGRAM:  Access Code: [email or web address] URL: [email or web address] Date: 08/13/2022 Prepared by: Consuela Mimes  Exercises - Prone Press Up  - 5-6 x daily - 7 x weekly - 1 sets - 10 reps - 1sec hold - Supine Piriformis Stretch with Foot on Ground  - 2 x daily - 7 x weekly - 3 sets - 30sec hold - Lower Trunk Rotations  - 2 x daily - 7 x weekly - 2 sets - 10 reps - Active Straight Leg Raise with Quad Set  - 1 x daily - 7 x weekly - 2 sets - 10 reps      ASSESSMENT:   CLINICAL IMPRESSION: Patient appears to be responding better with repeated extension for short term in clinic with improved tolerance of frontal plane and transverse plane ROM. Minimal c/o pain following repeated extension in clinic. Pt does have same NPRS and reports similar level of pain and minimal changes between visits and following last few weeks of PT. Pt has been limited with HEP compliance, and she may not get notable benefit from repeated movement program and exercise  with limited participation at home. Pt tolerates lumbopelvic ROM and posterior chain strengthening drills well today. Pt has remaining impairments in thoracolumbar AROM, quadriceps tightness, taut/tender gluteal mm and deep hip ER, postural changes, and L lumbar flank pain with LLE referral as far as L ankle. Patient will benefit from continued skilled therapeutic intervention to address the above deficits as needed for improved function and QoL.       OBJECTIVE IMPAIRMENTS: Abnormal gait, difficulty walking, decreased ROM, decreased strength, hypomobility, impaired flexibility, postural dysfunction, and pain.    ACTIVITY LIMITATIONS: lifting, sitting, standing, transfers, and locomotion level   PARTICIPATION LIMITATIONS: shopping, community activity, and outdoor walking   PERSONAL FACTORS: Past/current experiences, Time since onset of injury/illness/exacerbation, and 3+ comorbidities: (Hx of MI, HLD, HTN, seizure disorder)  are also affecting patient's functional outcome.    REHAB POTENTIAL: Good   CLINICAL DECISION MAKING: Evolving/moderate complexity   EVALUATION COMPLEXITY: Moderate     GOALS: Goals reviewed with patient? No   SHORT TERM GOALS: Target date: 08/20/2022    Pt will be independent with HEP in  order to improve strength and decrease back pain to improve pain-free function at home and work. Baseline: 07/28/22: Baseline HEP initiated with focus on repeated flexion and piriformis stretching.  Goal status: INITIAL     LONG TERM GOALS: Target date: 09/10/2022   Pt will increase FOTO to at least 67 to demonstrate significant improvement in function at home and work related to back pain  Baseline: 07/28/22: 63 Goal status: INITIAL   2.  Pt will decrease worst back pain by at least 2 points on the NPRS in order to demonstrate clinically significant reduction in back pain. Baseline: 07/28/22: 10/10 at worst  Goal status: INITIAL   3.  Patient will have full thoracolumbar AROM  without reproduction of pain as needed for reaching items on ground, household chores, bending.    Baseline: 07/28/22: pain with R sidebend/rotation, pain and motion loss with flexion and extension.  Goal status: INITIAL   4.  Pt will resume outdoor walking as desired without reproduction of pain > 0-1/10 as needed for participation in her regular hobby Baseline: 07/28/22: Pt limited in outdoor walking routine Goal status: INITIAL     PLAN: PT FREQUENCY: 1-2x/week   PT DURATION: 6 weeks   PLANNED INTERVENTIONS: Therapeutic exercises, Therapeutic activity, Neuromuscular re-education, Balance training, Gait training, Patient/Family education, Self Care, Joint mobilization, Joint manipulation, Vestibular training, Canalith repositioning, Orthotic/Fit training, DME instructions, Dry Needling, Electrical stimulation, Spinal manipulation, Spinal mobilization, Cryotherapy, Moist heat, Taping, Traction, Ultrasound, Ionotophoresis 4mg /ml Dexamethasone, Manual therapy, and Re-evaluation.   PLAN FOR NEXT SESSION:  Assess response with repeated extension, use MT for symptom modulation as needed and progress with posterior chain strengthening and graded movement as tolerated.     Consuela Mimes, PT, DPT #[id number]  Gertie Exon, PT 08/18/2022, 9:42 AM

## 2022-08-25 ENCOUNTER — Ambulatory Visit: Payer: 59 | Admitting: Physical Therapy

## 2022-08-25 ENCOUNTER — Encounter: Payer: Self-pay | Admitting: Physical Therapy

## 2022-08-25 DIAGNOSIS — M5442 Lumbago with sciatica, left side: Secondary | ICD-10-CM | POA: Diagnosis not present

## 2022-08-25 DIAGNOSIS — G8929 Other chronic pain: Secondary | ICD-10-CM

## 2022-08-25 DIAGNOSIS — M6281 Muscle weakness (generalized): Secondary | ICD-10-CM

## 2022-08-25 DIAGNOSIS — R262 Difficulty in walking, not elsewhere classified: Secondary | ICD-10-CM

## 2022-08-25 NOTE — Therapy (Unsigned)
OUTPATIENT PHYSICAL THERAPY TREATMENT NOTE   Patient Name: Samantha Watson MRN: [id number] DOB:11/17/1970, 52 y.o., female Today's Date: 08/25/2022   END OF SESSION:   PT End of Session - 08/25/22 0907     Visit Number 7    Date for PT Re-Evaluation 09/08/22    Authorization Type [employer]    Progress Note Due on Visit 10    PT Start Time 0907    PT Stop Time 0946    PT Time Calculation (min) 39 min    Activity Tolerance Patient tolerated treatment well    Behavior During Therapy [facility] for tasks assessed/performed              Past Medical History:  Diagnosis Date   Hyperlipidemia    Hypertension    MI (myocardial infarction) (HCC) 2016   Seizures (HCC)    Past Surgical History:  Procedure Laterality Date   CARDIAC CATHETERIZATION  2016   possible    LEFT HEART CATH AND CORONARY ANGIOGRAPHY Left 04/07/2017   Procedure: LEFT HEART CATH AND CORONARY ANGIOGRAPHY;  Surgeon: Marcina Millard, MD;  Location: [facility] INVASIVE [facility];  Service: Cardiovascular;  Laterality: Left;   There are no problems to display for this patient.   PCP: Etheleen Nicks, NP  REFERRING PROVIDER: Gerlene Burdock, NP   REFERRING DIAG:  [phone] (ICD-10-CM) - Lumbago with sciatica, left side  G89.29 (ICD-10-CM) - Other chronic pain  M54.16 (ICD-10-CM) - Radiculopathy, lumbar region  M48.061 (ICD-10-CM) - Spinal stenosis, lumbar region without neurogenic claudication    THERAPY DIAG:  Chronic left-sided low back pain with left-sided sciatica  Difficulty in walking, not elsewhere classified  Muscle weakness (generalized)  Rationale for Evaluation and Treatment Rehabilitation  PERTINENT HISTORY:  Pt is a 52 year old female with primary complaint of low back pain with left-sided pain/sciatica. Atraumatic onset. Pt reports getting up one day and noticing it hurting more without specific injury. Pt reports being unable to complete hobbies (e.g. planting flowers, walking exercise regimen).  Hx of MI in 2016. Pt reports good cardiology follow-ups since then. Pt reports some pain into LLE. Pt reports some AM stiffness. Pt reports pain down to distal shin/distal leg region. Patient reports that medicine recommended by MD helps "a little." Pt feels she is getting adequate sleep each day.    PAIN:    Pain Intensity: Present: 8/10, Best: 6/10, Worst: 10/10 Pain location: L flank and radiating down length of LLE  Pain Quality: burning  Radiating: Yes , down LLE  Numbness/Tingling: Yes Focal Weakness: Yes, sometimes it feels like she may fall Aggravating factors: walking, mild pain with standing, mild pain with sitting (pt alternates between sitting/standing frequently),  Relieving factors: Tylenol Arthritis, pillow behind back in sitting 24-hour pain behavior: variable, sometimes worse in day and sometimes at night  History of prior back injury, pain, surgery, or therapy: No   Imaging: Yes ,   EXAM: Magnetic resonance imaging, spinal canal and contents, lumbar, without contrast material.    Heterogeneous bone marrow signal abnormality likely a sequela of degenerative change. Small benign osseous hemangioma in the L3 vertebral body. The conus medullaris ends at a normal level.  The vertebral bodies are normally aligned. 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 Multilevel disc space narrowing and disc desiccation.  T12-L1: Mild concentric disc bulge with no significant spinal canal or neural foraminal narrowing.  L1-L2: No significant spinal canal or neural foraminal narrowing. There is mild bilateral facet  arthropathy.  L2-L3: Mild concentric disc bulge with no significant spinal canal narrowing. Mild bilateral neuroforaminal narrowing, greater on the left due to asymmetric facet arthropathy.  L3-L4: Moderate spinal canal narrowing due to a combination of concentric disc bulge with superimposed small right subarticular  disc protrusion resulting in narrowing of the right lateral recess and indenting/impinging the traversing right L4 nerve root. Moderate bilateral neuroforaminal narrowing due to prominent facet arthropathy and disc bulging extending into the foraminal zones.  L4-L5: Moderate spinal canal narrowing due to a combination of concentric disc bulge and bilateral facet arthropathy. Disc bulge slightly indents the bilateral traversing L5 nerve roots, greater on the right. There is posterior disc annular fissuring. Moderate to severe bilateral neuroforaminal narrowing, greater on the left.  L5-S1: Mild disc bulging with no significant spinal canal or neural foraminal narrowing. There is bilateral facet arthropathy.        Red flags: Negative for bowel/bladder changes, saddle paresthesia, personal history of cancer, h/o spinal tumors, h/o compression fx, h/o abdominal aneurysm, abdominal pain, chills/fever, night sweats, nausea, vomiting, unrelenting pain, first onset of insidious LBP <20 y/o     PRECAUTIONS: None   WEIGHT BEARING RESTRICTIONS: No   FALLS: Has patient fallen in last 6 months? No   Living Environment Lives with:  pt lives beside her father and her stepmom Lives in: House/apartment     Prior level of function: Independent   Occupational demands: -   Hobbies: Gardening, planting flowers, outdoor walking   Patient Goals: Improved pain, return to hobbies      PRECAUTIONS: PCN allergy   SUBJECTIVE:                                                                                                                                                                                      SUBJECTIVE STATEMENT:  Patient reports some soreness and stiffness after last visit. She reports completing her HEP as discussed last visit; 1-2x/day at home. Pt reports some stiffness in L thigh and down to L knee and a little pain in her back. Patient reports pain with cleaning up in house and washing  dishes for longer period.  Pt states that sometimes she does not sleep good due to pain; pt reports that sleep has been fair the last few nights.    PAIN:  Are you having pain? 7-8/10 pain affecting L side of back and her L thigh     OBJECTIVE: (objective measures completed at initial evaluation unless otherwise dated)   Patient Surveys  FOTO 63, predicted outcome score of 67         GAIT: Distance walked: 60 ft Assistive device utilized:  None Level of assistance: Complete Independence Comments: Antalgic pattern with dec stance time on L side, decreased arm swing/trunk rotation   Posture: Guarded posture of thoracolumbar spine Lumbar lordosis: WNL Lumbar lateral shift: Negative   AROM     AROM (Normal range in degrees) AROM   Lumbar    Flexion (65) 75%*  Extension (30) 75%*  Right lateral flexion (25) 100% (pull on L flank)  Left lateral flexion (25) 100%  Right rotation (30) 100%*  Left rotation (30) 100%         Hip Right Left  Flexion (125)      Extension (15)      Abduction (40)      Adduction       Internal Rotation (45)      External Rotation (45)             (* = pain; Blank rows = not tested)     LE MMT: MMT (out of 5) Right   Left    Hip flexion 4-* 4-*  Hip extension      Hip abduction (seated) 4 4  Hip adduction 5 5  Hip internal rotation      Hip external rotation      Knee flexion      Knee extension 4+ 4  Ankle dorsiflexion 4+ 4+  Ankle plantarflexion      Ankle inversion      Ankle eversion      (* = pain; Blank rows = not tested)   Sensation Grossly intact to light touch throughout bilateral LEs as determined by testing dermatomes L2-S2. Proprioception, stereognosis, and hot/cold testing deferred on this date.   Reflexes R/L Knee Jerk (L3/4): 3+/3+  Ankle Jerk (S1/2): 3+/3+  [Pt is generally hyperreflexic]   Muscle Length Hamstrings: R: Negative L: Negative E[place] (quadriceps): R: Positive L: Positive     Palpation Location  Right Left         Lumbar paraspinals 0 2  Quadratus Lumborum 0 0  Gluteus Maximus 0 2  Gluteus Medius 0 2  Deep hip external rotators 0 2  PSIS 1 1  Fortin's Area (SIJ)      Greater Trochanter      (Blank rows = not tested) Graded on 0-4 scale (0 = no pain, 1 = pain, 2 = pain with wincing/grimacing/flinching, 3 = pain with withdrawal, 4 = unwilling to allow palpation)   Passive Accessory Intervertebral Motion Reproduction of back pain with CPA L3-S1, empty end-feel   Special Tests Lumbar Radiculopathy and Discogenic: Centralization and Peripheralization (SN 92, -LR 0.12): Minimal c/o pain in lying following repeated flexion in lying (double knee to chest) Slump (SN 83, -LR 0.32): R: Negative L: Negative SLR (SN 92, -LR 0.29): R: Negative L:  Negative   Clonus: 2-3 beats RLE, 3-4 beats LLE Hoffman's Sign: R Negative L Negative Babinski: R Negative, L Negative    Lumbar spine compression: Positive for pain provocation Lumbar spine general distraction in hooklying: Negative for symptom relief    Facet Joint: Extension-Rotation (SN 100, -LR 0.0): R: Negative (pain on L with provocative position), L: Positive   Lumbar Foraminal Stenosis: Lumbar quadrant (SN 70): R: Negative L: Positive    Piriformis Syndrome: PACE Sign: Mild positive L       TODAY'S TREATMENT:     Manual Therapy - for symptom modulation, soft tissue sensitivity and mobility, joint mobility, ROM   Bilateral LE distraction, hooklying lumbar traction with Mulligan belt x 5 minutes  -  minimal pain reported during treatment, tenuous feedback to treatment response afterward STM and IASTM with Hypervolt along bilat erector spine L3-5; x 8 minutes   *not today* LLE long-leg distraction with pt in hooklying on table; 10 sec on, 5 sec off; x 1 minute  -discomfort in L knee with full extension position     Therapeutic Exercise - for improved soft tissue flexibility and extensibility as needed for ROM,  MDT/repeated movement to modulate pain and improve ROM   AROM Lumbar flexion 75%* (pain back and L leg)  Lumbar extension 100%* (burning pain) Lateral flexion: R 100%, L 100% Thoracolumbar rotation: R 100%, L 100%*   Repeated extension in lying; 2x10, 1 sec   -no effect during, no pain after  -lateral flexion to R improve. Tolerance of extension improved Repeated extension in lying, with patient overpessure; 1x10, 1 sec  Bridge with neutral lumbar spine; 2x10   Bird dog; 1x10 alternating R/L    PATIENT EDUCATION: We reiterated importance of HEP frequency, especially frequency with repeated movement/MDT program. We reviewed activity modification to prevent flare-up of back/lower quarter pain and discussed potential next steps with re-assessment next visit.     *not today* Swiss's ball march, seated on Green swiss ball; 2x10 alternating R/L  Wall ball partial squat, Green swiss ball; 2x10 Anterior pelvic tilt; 2x10 Lower trunk rotation; x10 alternating R/L Open book; 1x10 lying on R and L side Quadruped sidebend; x10 alternating R/L Repeated flexion in lying (double knee to chest); 1x10; Repeated flexion in lying (double knee to chest); 1x10, clinician overpressure;  SLR; 2x10 with each LE Piriformis stretch, figure-4 pull; 2 x 30 sec       PATIENT EDUCATION:  Education details: see above for patient education details Person educated: Patient Education method: Explanation, Demonstration, and Handouts Education comprehension: verbalized understanding and returned demonstration     HOME EXERCISE PROGRAM:  Access Code: [email or web address] URL: [email or web address] Date: 08/13/2022 Prepared by: Consuela Mimes  Exercises - Prone Press Up  - 5-6 x daily - 7 x weekly - 1 sets - 10 reps - 1sec hold  *with patient overpressure (inhale/exhale at end-range) - Supine Piriformis Stretch with Foot on Ground  - 2 x daily - 7 x weekly - 3 sets - 30sec hold - Lower Trunk  Rotations  - 2 x daily - 7 x weekly - 2 sets - 10 reps - Active Straight Leg Raise with Quad Set  - 1 x daily - 7 x weekly - 2 sets - 10 reps      ASSESSMENT:   CLINICAL IMPRESSION: Patient does have overall improved transverse and frontal plane thoracolumbar AROM tolerance following repeated extension. Minimal referred pain reported after traction and following repeated extension. Pt is still reporting similar referral patter for her pain and same NPRS compared to her initial visits. We may consider screening out potential lateral component for her L-spine derangement with repeated sideglides and we will complete re-assessment next visit to determine progress with PT. If progress is suboptimal, pt may need referral back to referring provider. HEP compliance has been limited to date, and this will likely limit effectiveness of repeated movement program. Pt has remaining impairments in thoracolumbar AROM, quadriceps tightness, taut/tender gluteal mm and deep hip ER, postural changes, and L lumbar flank pain with LLE referral as far as L ankle. Patient will benefit from continued skilled therapeutic intervention to address the above deficits as needed for improved function and QoL.       OBJECTIVE IMPAIRMENTS:  Abnormal gait, difficulty walking, decreased ROM, decreased strength, hypomobility, impaired flexibility, postural dysfunction, and pain.    ACTIVITY LIMITATIONS: lifting, sitting, standing, transfers, and locomotion level   PARTICIPATION LIMITATIONS: shopping, community activity, and outdoor walking   PERSONAL FACTORS: Past/current experiences, Time since onset of injury/illness/exacerbation, and 3+ comorbidities: (Hx of MI, HLD, HTN, seizure disorder)  are also affecting patient's functional outcome.    REHAB POTENTIAL: Good   CLINICAL DECISION MAKING: Evolving/moderate complexity   EVALUATION COMPLEXITY: Moderate     GOALS:   SHORT TERM GOALS: Target date: 08/20/2022    Pt will be  independent with HEP in order to improve strength and decrease back pain to improve pain-free function at home and work. Baseline: 07/28/22: Baseline HEP initiated with focus on repeated flexion and piriformis stretching.  Goal status: INITIAL     LONG TERM GOALS: Target date: 09/10/2022   Pt will increase FOTO to at least 67 to demonstrate significant improvement in function at home and work related to back pain  Baseline: 07/28/22: 63 Goal status: INITIAL   2.  Pt will decrease worst back pain by at least 2 points on the NPRS in order to demonstrate clinically significant reduction in back pain. Baseline: 07/28/22: 10/10 at worst  Goal status: INITIAL   3.  Patient will have full thoracolumbar AROM without reproduction of pain as needed for reaching items on ground, household chores, bending.    Baseline: 07/28/22: pain with R sidebend/rotation, pain and motion loss with flexion and extension.  Goal status: INITIAL   4.  Pt will resume outdoor walking as desired without reproduction of pain > 0-1/10 as needed for participation in her regular hobby Baseline: 07/28/22: Pt limited in outdoor walking routine Goal status: INITIAL     PLAN: PT FREQUENCY: 1-2x/week   PT DURATION: 6 weeks   PLANNED INTERVENTIONS: Therapeutic exercises, Therapeutic activity, Neuromuscular re-education, Balance training, Gait training, Patient/Family education, Self Care, Joint mobilization, Joint manipulation, Vestibular training, Canalith repositioning, Orthotic/Fit training, DME instructions, Dry Needling, Electrical stimulation, Spinal manipulation, Spinal mobilization, Cryotherapy, Moist heat, Taping, Traction, Ultrasound, Ionotophoresis 4mg /ml Dexamethasone, Manual therapy, and Re-evaluation.   PLAN FOR NEXT SESSION:  Assess response with repeated extension (with inclusion of patient overpressure), use MT for symptom modulation as needed and progress with posterior chain strengthening and graded movement as  tolerated.     Consuela Mimes, PT, DPT #[id number]  Gertie Exon, PT 08/25/2022, 10:21 AM

## 2022-08-27 ENCOUNTER — Ambulatory Visit: Payer: 59 | Admitting: Physical Therapy

## 2022-08-27 DIAGNOSIS — M5442 Lumbago with sciatica, left side: Secondary | ICD-10-CM | POA: Diagnosis not present

## 2022-08-27 DIAGNOSIS — M6281 Muscle weakness (generalized): Secondary | ICD-10-CM

## 2022-08-27 DIAGNOSIS — G8929 Other chronic pain: Secondary | ICD-10-CM

## 2022-08-27 DIAGNOSIS — R262 Difficulty in walking, not elsewhere classified: Secondary | ICD-10-CM

## 2022-08-27 NOTE — Therapy (Addendum)
OUTPATIENT PHYSICAL THERAPY TREATMENT NOTE/GOAL UPDATE   Patient Name: Samantha Watson MRN: [id number] DOB:1970-05-31, 52 y.o., female Today's Date: 08/27/2022   END OF SESSION:   PT End of Session - 08/27/22 0857     Visit Number 8    Date for PT Re-Evaluation 09/08/22    Authorization Type [employer]    Progress Note Due on Visit 10    PT Start Time 0858    PT Stop Time 0943    PT Time Calculation (min) 45 min    Activity Tolerance Patient tolerated treatment well    Behavior During Therapy [facility] for tasks assessed/performed             Past Medical History:  Diagnosis Date   Hyperlipidemia    Hypertension    MI (myocardial infarction) (HCC) 2016   Seizures (HCC)    Past Surgical History:  Procedure Laterality Date   CARDIAC CATHETERIZATION  2016   possible    LEFT HEART CATH AND CORONARY ANGIOGRAPHY Left 04/07/2017   Procedure: LEFT HEART CATH AND CORONARY ANGIOGRAPHY;  Surgeon: Marcina Millard, MD;  Location: [facility] INVASIVE CV LAB;  Service: Cardiovascular;  Laterality: Left;   There are no problems to display for this patient.   PCP: Etheleen Nicks, NP  REFERRING PROVIDER: Gerlene Burdock, NP   REFERRING DIAG:  [phone] (ICD-10-CM) - Lumbago with sciatica, left side  G89.29 (ICD-10-CM) - Other chronic pain  M54.16 (ICD-10-CM) - Radiculopathy, lumbar region  M48.061 (ICD-10-CM) - Spinal stenosis, lumbar region without neurogenic claudication    THERAPY DIAG:  Chronic left-sided low back pain with left-sided sciatica  Difficulty in walking, not elsewhere classified  Muscle weakness (generalized)  Rationale for Evaluation and Treatment Rehabilitation  PERTINENT HISTORY:  Pt is a 52 year old female with primary complaint of low back pain with left-sided pain/sciatica. Atraumatic onset. Pt reports getting up one day and noticing it hurting more without specific injury. Pt reports being unable to complete hobbies (e.g. planting flowers, walking exercise  regimen). Hx of MI in 2016. Pt reports good cardiology follow-ups since then. Pt reports some pain into LLE. Pt reports some AM stiffness. Pt reports pain down to distal shin/distal leg region. Patient reports that medicine recommended by MD helps "a little." Pt feels she is getting adequate sleep each day.    PAIN:    Pain Intensity: Present: 8/10, Best: 6/10, Worst: 10/10 Pain location: L flank and radiating down length of LLE  Pain Quality: burning  Radiating: Yes , down LLE  Numbness/Tingling: Yes Focal Weakness: Yes, sometimes it feels like she may fall Aggravating factors: walking, mild pain with standing, mild pain with sitting (pt alternates between sitting/standing frequently),  Relieving factors: Tylenol Arthritis, pillow behind back in sitting 24-hour pain behavior: variable, sometimes worse in day and sometimes at night  History of prior back injury, pain, surgery, or therapy: No   Imaging: Yes ,   EXAM: Magnetic resonance imaging, spinal canal and contents, lumbar, without contrast material.    Heterogeneous bone marrow signal abnormality likely a sequela of degenerative change. Small benign osseous hemangioma in the L3 vertebral body. The conus medullaris ends at a normal level.  The vertebral bodies are normally aligned. 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 Multilevel disc space narrowing and disc desiccation.  T12-L1: Mild concentric disc bulge with no significant spinal canal or neural foraminal narrowing.  L1-L2: No significant spinal canal or neural foraminal narrowing. There is mild bilateral facet  arthropathy.  L2-L3: Mild concentric disc bulge with no significant spinal canal narrowing. Mild bilateral neuroforaminal narrowing, greater on the left due to asymmetric facet arthropathy.  L3-L4: Moderate spinal canal narrowing due to a combination of concentric disc bulge with superimposed small right  subarticular disc protrusion resulting in narrowing of the right lateral recess and indenting/impinging the traversing right L4 nerve root. Moderate bilateral neuroforaminal narrowing due to prominent facet arthropathy and disc bulging extending into the foraminal zones.  L4-L5: Moderate spinal canal narrowing due to a combination of concentric disc bulge and bilateral facet arthropathy. Disc bulge slightly indents the bilateral traversing L5 nerve roots, greater on the right. There is posterior disc annular fissuring. Moderate to severe bilateral neuroforaminal narrowing, greater on the left.  L5-S1: Mild disc bulging with no significant spinal canal or neural foraminal narrowing. There is bilateral facet arthropathy.        Red flags: Negative for bowel/bladder changes, saddle paresthesia, personal history of cancer, h/o spinal tumors, h/o compression fx, h/o abdominal aneurysm, abdominal pain, chills/fever, night sweats, nausea, vomiting, unrelenting pain, first onset of insidious LBP <20 y/o     PRECAUTIONS: None   WEIGHT BEARING RESTRICTIONS: No   FALLS: Has patient fallen in last 6 months? No   Living Environment Lives with:  pt lives beside her father and her stepmom Lives in: House/apartment     Prior level of function: Independent   Occupational demands: -   Hobbies: Gardening, planting flowers, outdoor walking   Patient Goals: Improved pain, return to hobbies      PRECAUTIONS: PCN allergy   SUBJECTIVE:                                                                                                                                                                                      SUBJECTIVE STATEMENT:  Patient reports doing well with HEP modification last visit. Patient reports having some pain, but states it is "not bad" this AM. Patient reports therapy has helped "a little" to date. Patient reports she went outside to get mail yesterday with some pain affecting back/L  leg with it being length of L leg at times. Patient reports 1-2x/day performing her repeated extensions.    PAIN:  Are you having pain? 7/10 pain affecting L side of back and her L thigh     OBJECTIVE: (objective measures completed at initial evaluation unless otherwise dated)   Patient Surveys  FOTO 63, predicted outcome score of 67         GAIT: Distance walked: 60 ft Assistive device utilized: None Level of assistance: Complete Independence Comments: Antalgic pattern with dec stance time on L side,  decreased arm swing/trunk rotation   Posture: Guarded posture of thoracolumbar spine Lumbar lordosis: WNL Lumbar lateral shift: Negative   AROM      AROM (Normal range in degrees) AROM  AROM 08/27/22  Lumbar     Flexion (65) 75%* 75%* (L gluteal/iliolumbar)  Extension (30) 75%* 100%*   Right lateral flexion (25) 100% (pull on L flank) 100%  Left lateral flexion (25) 100% 100%  Right rotation (30) 100%* 100%  Left rotation (30) 100% 100%          Hip Right Left   Flexion (125)       Extension (15)       Abduction (40)       Adduction        Internal Rotation (45)       External Rotation (45)               (* = pain; Blank rows = not tested)     LE MMT: MMT (out of 5) Right   Left    Hip flexion 4-* 4-*  Hip extension      Hip abduction (seated) 4 4  Hip adduction 5 5  Hip internal rotation      Hip external rotation      Knee flexion      Knee extension 4+ 4  Ankle dorsiflexion 4+ 4+  Ankle plantarflexion      Ankle inversion      Ankle eversion      (* = pain; Blank rows = not tested)   Sensation Grossly intact to light touch throughout bilateral LEs as determined by testing dermatomes L2-S2. Proprioception, stereognosis, and hot/cold testing deferred on this date.   Reflexes R/L Knee Jerk (L3/4): 3+/3+  Ankle Jerk (S1/2): 3+/3+  [Pt is generally hyperreflexic]   Muscle Length Hamstrings: R: Negative L: Negative Ely (quadriceps): R: Positive L:  Positive     Palpation Location Right Left         Lumbar paraspinals 0 2  Quadratus Lumborum 0 0  Gluteus Maximus 0 2  Gluteus Medius 0 2  Deep hip external rotators 0 2  PSIS 1 1  Fortin's Area (SIJ)      Greater Trochanter      (Blank rows = not tested) Graded on 0-4 scale (0 = no pain, 1 = pain, 2 = pain with wincing/grimacing/flinching, 3 = pain with withdrawal, 4 = unwilling to allow palpation)   Passive Accessory Intervertebral Motion Reproduction of back pain with CPA L3-S1, empty end-feel   Special Tests Lumbar Radiculopathy and Discogenic: Centralization and Peripheralization (SN 92, -LR 0.12): Minimal c/o pain in lying following repeated flexion in lying (double knee to chest) Slump (SN 83, -LR 0.32): R: Negative L: Negative SLR (SN 92, -LR 0.29): R: Negative L:  Negative   Clonus: 2-3 beats RLE, 3-4 beats LLE Hoffman's Sign: R Negative L Negative Babinski: R Negative, L Negative    Lumbar spine compression: Positive for pain provocation Lumbar spine general distraction in hooklying: Negative for symptom relief    Facet Joint: Extension-Rotation (SN 100, -LR 0.0): R: Negative (pain on L with provocative position), L: Positive   Lumbar Foraminal Stenosis: Lumbar quadrant (SN 70): R: Negative L: Positive    Piriformis Syndrome: PACE Sign: Mild positive L       TODAY'S TREATMENT:     Manual Therapy - for symptom modulation, soft tissue sensitivity and mobility, joint mobility, ROM   LLE long-leg distraction with pt in  hooklying on table; 10 sec on, 5 sec off; x 3 minutes   *not today* STM and IASTM with Hypervolt along bilat erector spine L3-5; x 8 minutes  -discomfort in L knee with full extension position     Therapeutic Exercise - for improved soft tissue flexibility and extensibility as needed for ROM, MDT/repeated movement to modulate pain and improve ROM   *GOAL UPDATE PERFORMED   Repeated extension in lying, with patient overpessure;  1x10, 1 sec   -no pain in lying after exercise   -minimal symptoms with walking laps around gym x 2   Prone Quadriceps stretch; 3 x 30 sec   Bridge with neutral lumbar spine; 2x10   Bird dog; 2x10 alternating R/L   Wall ball partial squat, Green swiss ball; 2x10  PATIENT EDUCATION: Discussed current progress, continued POC and potential referral back to MD if suboptimal progress is noted moving forward    *not today* Swiss's ball march, seated on Green swiss ball; 2x10 alternating R/L  Anterior pelvic tilt; 2x10 Lower trunk rotation; x10 alternating R/L Open book; 1x10 lying on R and L side Quadruped sidebend; x10 alternating R/L Repeated flexion in lying (double knee to chest); 1x10; Repeated flexion in lying (double knee to chest); 1x10, clinician overpressure;  SLR; 2x10 with each LE Piriformis stretch, figure-4 pull; 2 x 30 sec       PATIENT EDUCATION:  Education details: see above for patient education details Person educated: Patient Education method: Explanation, Demonstration, and Handouts Education comprehension: verbalized understanding and returned demonstration     HOME EXERCISE PROGRAM:  Access Code: [email or web address] URL: [email or web address] Date: 08/27/2022 Prepared by: Consuela Mimes  Exercises - Prone Press Up  - 5-6 x daily - 7 x weekly - 1 sets - 10 reps - 1sec hold - Supine Piriformis Stretch with Foot on Ground  - 2 x daily - 7 x weekly - 3 sets - 30sec hold - Lower Trunk Rotations  - 2 x daily - 7 x weekly - 2 sets - 10 reps - Active Straight Leg Raise with Quad Set  - 1 x daily - 7 x weekly - 2 sets - 10 reps - Prone Quadriceps Stretch with Strap  - 2 x daily - 7 x weekly - 3 sets - 30sec hold      ASSESSMENT:   CLINICAL IMPRESSION: Patient has NPRS at worst reduced by only 1 point on NPRS and has not improved FOTO score. Pt is still notably limited with outdoor walking/community-level ambulation. Patient has modestly improved ROM  baselines and tolerates transverse and frontal plane motion well compared to initial eval. Pt does respond well in office with repeated extension including force progression (patient overpressure) and has minimal symptoms in lying and with walking following repeated movement performed. Patient has been limited with repeated movement frequency/HEP compliance, and we have discussed importance of consistency with home program/repeated extension to get best benefit of therapy. We reviewed home program at length and provided handout with overview of parameters. Pt has remaining impairments in thoracolumbar AROM, quadriceps tightness, taut/tender gluteal mm and deep hip ER, postural changes, and L lumbar flank pain with LLE referral as far as L ankle. Patient will benefit from continued skilled therapeutic intervention to address the above deficits as needed for improved function and QoL.       OBJECTIVE IMPAIRMENTS: Abnormal gait, difficulty walking, decreased ROM, decreased strength, hypomobility, impaired flexibility, postural dysfunction, and pain.    ACTIVITY LIMITATIONS: lifting, sitting, standing, transfers, and locomotion  level   PARTICIPATION LIMITATIONS: shopping, community activity, and outdoor walking   PERSONAL FACTORS: Past/current experiences, Time since onset of injury/illness/exacerbation, and 3+ comorbidities: (Hx of MI, HLD, HTN, seizure disorder)  are also affecting patient's functional outcome.    REHAB POTENTIAL: Good   CLINICAL DECISION MAKING: Evolving/moderate complexity   EVALUATION COMPLEXITY: Moderate     GOALS:   SHORT TERM GOALS: Target date: 08/20/2022    Pt will be independent with HEP in order to improve strength and decrease back pain to improve pain-free function at home and work. Baseline: 07/28/22: Baseline HEP initiated with focus on repeated flexion and piriformis stretching.    08/27/22: Partial compliance, limited HEP frequency for repeated movement program  (1-2x/day).  Goal status: ON-GOING     LONG TERM GOALS: Target date: 09/10/2022   Pt will increase FOTO to at least 67 to demonstrate significant improvement in function at home and work related to back pain  Baseline: 07/28/22: 63.  08/27/22: 60/67 Goal status: NOT MET    2.  Pt will decrease worst back pain by at least 2 points on the NPRS in order to demonstrate clinically significant reduction in back pain. Baseline: 07/28/22: 10/10 at worst    08/27/22: 9/10 at worst over last week.  Goal status: NOT MET    3.  Patient will have full thoracolumbar AROM without reproduction of pain as needed for reaching items on ground, household chores, bending.    Baseline: 07/28/22: pain with R sidebend/rotation, pain and motion loss with flexion and extension.    08/27/22: Pain mainly with flexion/extension, mild flexion motion loss.  Goal status: IN PROGRESS    4.  Pt will resume outdoor walking as desired without reproduction of pain > 0-1/10 as needed for participation in her regular hobby Baseline: 07/28/22: Pt limited in outdoor walking routine.    08/27/22: Pain with walking to mailbox; okay walking next door to her father's house.  Goal status: ON-GOING      PLAN: PT FREQUENCY: 1-2x/week   PT DURATION: 3-4 weeks   PLANNED INTERVENTIONS: Therapeutic exercises, Therapeutic activity, Neuromuscular re-education, Balance training, Gait training, Patient/Family education, Self Care, Joint mobilization, Joint manipulation, Vestibular training, Canalith repositioning, Orthotic/Fit training, DME instructions, Dry Needling, Electrical stimulation, Spinal manipulation, Spinal mobilization, Cryotherapy, Moist heat, Taping, Traction, Ultrasound, Ionotophoresis 4mg /ml Dexamethasone, Manual therapy, and Re-evaluation.   PLAN FOR NEXT SESSION:  Extension principle MDT with progression of force prn, use MT for symptom modulation as needed and progress with posterior chain strengthening and graded movement as  tolerated.  Consider referral back to MD versus ongoing PT plan of care as pt approaches 10 visits.      Consuela Mimes, PT, DPT #[id number]  Gertie Exon, PT 08/27/2022, 9:43 AM

## 2022-08-31 ENCOUNTER — Encounter: Payer: Self-pay | Admitting: Physical Therapy

## 2022-09-01 ENCOUNTER — Encounter: Payer: Self-pay | Admitting: Physical Therapy

## 2022-09-01 ENCOUNTER — Ambulatory Visit: Payer: 59 | Admitting: Physical Therapy

## 2022-09-01 DIAGNOSIS — M5442 Lumbago with sciatica, left side: Secondary | ICD-10-CM | POA: Diagnosis not present

## 2022-09-01 DIAGNOSIS — M6281 Muscle weakness (generalized): Secondary | ICD-10-CM

## 2022-09-01 DIAGNOSIS — R262 Difficulty in walking, not elsewhere classified: Secondary | ICD-10-CM

## 2022-09-01 DIAGNOSIS — G8929 Other chronic pain: Secondary | ICD-10-CM

## 2022-09-01 NOTE — Therapy (Signed)
OUTPATIENT PHYSICAL THERAPY TREATMENT NOTE   Patient Name: Samantha Watson MRN: [id number] DOB:[birth year], 52 y.o., female Today's Date: 09/01/2022   END OF SESSION:   PT End of Session - 09/01/22 0807     Visit Number 9    Date for PT Re-Evaluation 09/08/22    Authorization Type [employer]    Progress Note Due on Visit 10    PT Start Time 0902    PT Stop Time 0942    PT Time Calculation (min) 40 min    Activity Tolerance Patient tolerated treatment well    Behavior During Therapy [facility] for tasks assessed/performed             Past Medical History:  Diagnosis Date   Hyperlipidemia    Hypertension    MI (myocardial infarction) (HCC) 2016   Seizures (HCC)    Past Surgical History:  Procedure Laterality Date   CARDIAC CATHETERIZATION  2016   possible    LEFT HEART CATH AND CORONARY ANGIOGRAPHY Left 04/07/2017   Procedure: LEFT HEART CATH AND CORONARY ANGIOGRAPHY;  Surgeon: Marcina Millard, MD;  Location: [facility] INVASIVE CV [facility];  Service: Cardiovascular;  Laterality: Left;   There are no problems to display for this patient.   PCP: Etheleen Nicks, NP  REFERRING PROVIDER: Gerlene Burdock, NP   REFERRING DIAG:  [phone] (ICD-10-CM) - Lumbago with sciatica, left side  G89.29 (ICD-10-CM) - Other chronic pain  M54.16 (ICD-10-CM) - Radiculopathy, lumbar region  M48.061 (ICD-10-CM) - Spinal stenosis, lumbar region without neurogenic claudication    THERAPY DIAG:  No diagnosis found.  Rationale for Evaluation and Treatment Rehabilitation  PERTINENT HISTORY:  Pt is a 52 year old female with primary complaint of low back pain with left-sided pain/sciatica. Atraumatic onset. Pt reports getting up one day and noticing it hurting more without specific injury. Pt reports being unable to complete hobbies (e.g. planting flowers, walking exercise regimen). Hx of MI in 2016. Pt reports good cardiology follow-ups since then. Pt reports some pain into LLE. Pt reports some AM  stiffness. Pt reports pain down to distal shin/distal leg region. Patient reports that medicine recommended by MD helps "a little." Pt feels she is getting adequate sleep each day.    PAIN:    Pain Intensity: Present: 8/10, Best: 6/10, Worst: 10/10 Pain location: L flank and radiating down length of LLE  Pain Quality: burning  Radiating: Yes , down LLE  Numbness/Tingling: Yes Focal Weakness: Yes, sometimes it feels like she may fall Aggravating factors: walking, mild pain with standing, mild pain with sitting (pt alternates between sitting/standing frequently),  Relieving factors: Tylenol Arthritis, pillow behind back in sitting 24-hour pain behavior: variable, sometimes worse in day and sometimes at night  History of prior back injury, pain, surgery, or therapy: No   Imaging: Yes ,   EXAM: Magnetic resonance imaging, spinal canal and contents, lumbar, without contrast material.    Heterogeneous bone marrow signal abnormality likely a sequela of degenerative change. Small benign osseous hemangioma in the L3 vertebral body. The conus medullaris ends at a normal level.  The vertebral bodies are normally aligned. 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 Multilevel disc space narrowing and disc desiccation.  T12-L1: Mild concentric disc bulge with no significant spinal canal or neural foraminal narrowing.  L1-L2: No significant spinal canal or neural foraminal narrowing. There is mild bilateral facet arthropathy.  L2-L3: Mild concentric disc bulge with no significant spinal canal narrowing. Mild bilateral neuroforaminal narrowing,  greater on the left due to asymmetric facet arthropathy.  L3-L4: Moderate spinal canal narrowing due to a combination of concentric disc bulge with superimposed small right subarticular disc protrusion resulting in narrowing of the right lateral recess and indenting/impinging the traversing right L4 nerve  root. Moderate bilateral neuroforaminal narrowing due to prominent facet arthropathy and disc bulging extending into the foraminal zones.  L4-L5: Moderate spinal canal narrowing due to a combination of concentric disc bulge and bilateral facet arthropathy. Disc bulge slightly indents the bilateral traversing L5 nerve roots, greater on the right. There is posterior disc annular fissuring. Moderate to severe bilateral neuroforaminal narrowing, greater on the left.  L5-S1: Mild disc bulging with no significant spinal canal or neural foraminal narrowing. There is bilateral facet arthropathy.        Red flags: Negative for bowel/bladder changes, saddle paresthesia, personal history of cancer, h/o spinal tumors, h/o compression fx, h/o abdominal aneurysm, abdominal pain, chills/fever, night sweats, nausea, vomiting, unrelenting pain, first onset of insidious LBP <20 y/o     PRECAUTIONS: None   WEIGHT BEARING RESTRICTIONS: No   FALLS: Has patient fallen in last 6 months? No   Living Environment Lives with:  pt lives beside her father and her stepmom Lives in: House/apartment     Prior level of function: Independent   Occupational demands: -   Hobbies: Gardening, planting flowers, outdoor walking   Patient Goals: Improved pain, return to hobbies      PRECAUTIONS: PCN allergy   SUBJECTIVE:                                                                                                                                                                                      SUBJECTIVE STATEMENT:  Patient reports doing well with repeated extension progression at home. Patient reports symptoms are about the same. Patient reports pain along L side of back into L thigh. Patient reports sometimes getting pain into calf some too. She reports having to use compression sleeve with some swelling in L leg. Patient reports 7/10 pain at arrival to PT. Patient reports completing home exercises including  repeated extension 1x/day; pt reports being tired or sometimes forgetting.    PAIN:  Are you having pain? 7/10 pain affecting L side of back and her L thigh     OBJECTIVE: (objective measures completed at initial evaluation unless otherwise dated)   Patient Surveys  FOTO 63, predicted outcome score of 67         GAIT: Distance walked: 60 ft Assistive device utilized: None Level of assistance: Complete Independence Comments: Antalgic pattern with dec stance time on L side, decreased arm swing/trunk rotation  Posture: Guarded posture of thoracolumbar spine Lumbar lordosis: WNL Lumbar lateral shift: Negative   AROM      AROM (Normal range in degrees) AROM  AROM 08/27/22  Lumbar     Flexion (65) 75%* 75%* (L gluteal/iliolumbar)  Extension (30) 75%* 100%*   Right lateral flexion (25) 100% (pull on L flank) 100%  Left lateral flexion (25) 100% 100%  Right rotation (30) 100%* 100%  Left rotation (30) 100% 100%          Hip Right Left   Flexion (125)       Extension (15)       Abduction (40)       Adduction        Internal Rotation (45)       External Rotation (45)               (* = pain; Blank rows = not tested)     LE MMT: MMT (out of 5) Right   Left    Hip flexion 4-* 4-*  Hip extension      Hip abduction (seated) 4 4  Hip adduction 5 5  Hip internal rotation      Hip external rotation      Knee flexion      Knee extension 4+ 4  Ankle dorsiflexion 4+ 4+  Ankle plantarflexion      Ankle inversion      Ankle eversion      (* = pain; Blank rows = not tested)   Sensation Grossly intact to light touch throughout bilateral LEs as determined by testing dermatomes L2-S2. Proprioception, stereognosis, and hot/cold testing deferred on this date.   Reflexes R/L Knee Jerk (L3/4): 3+/3+  Ankle Jerk (S1/2): 3+/3+  [Pt is generally hyperreflexic]   Muscle Length Hamstrings: R: Negative L: Negative Ely (quadriceps): R: Positive L: Positive      Palpation Location Right Left         Lumbar paraspinals 0 2  Quadratus Lumborum 0 0  Gluteus Maximus 0 2  Gluteus Medius 0 2  Deep hip external rotators 0 2  PSIS 1 1  Fortin's Area (SIJ)      Greater Trochanter      (Blank rows = not tested) Graded on 0-4 scale (0 = no pain, 1 = pain, 2 = pain with wincing/grimacing/flinching, 3 = pain with withdrawal, 4 = unwilling to allow palpation)   Passive Accessory Intervertebral Motion Reproduction of back pain with CPA L3-S1, empty end-feel   Special Tests Lumbar Radiculopathy and Discogenic: Centralization and Peripheralization (SN 92, -LR 0.12): Minimal c/o pain in lying following repeated flexion in lying (double knee to chest) Slump (SN 83, -LR 0.32): R: Negative L: Negative SLR (SN 92, -LR 0.29): R: Negative L:  Negative   Clonus: 2-3 beats RLE, 3-4 beats LLE Hoffman's Sign: R Negative L Negative Babinski: R Negative, L Negative    Lumbar spine compression: Positive for pain provocation Lumbar spine general distraction in hooklying: Negative for symptom relief    Facet Joint: Extension-Rotation (SN 100, -LR 0.0): R: Negative (pain on L with provocative position), L: Positive   Lumbar Foraminal Stenosis: Lumbar quadrant (SN 70): R: Negative L: Positive    Piriformis Syndrome: PACE Sign: Mild positive L       TODAY'S TREATMENT:     Manual Therapy - for symptom modulation, soft tissue sensitivity and mobility, joint mobility, ROM    *not today* LLE long-leg distraction with pt in hooklying on table;  10 sec on, 5 sec off; x 3 minutes STM and IASTM with Hypervolt along bilat erector spine L3-5; x 8 minutes  -discomfort in L knee with full extension position     Therapeutic Exercise - for improved soft tissue flexibility and extensibility as needed for ROM, MDT/repeated movement to modulate pain and improve ROM    AROM Lumbar flexion 75% (mild back pain, "not much") Lumbar extension 100% ("not much") Lateral  flexion: R 100%, L 100% Thoracolumbar rotation: R 100%, L 100%   Repeated extension in lying, with patient overpessure; 1x10, 1 sec   -no change  Repeated extension in lying, with clinician overpessure; 1x10, 1 sec -no significant pain in lying or walking after repeated movement   Prone Quadriceps stretch; 3 x 30 sec  Active hamstrings stretch/sciatic flossing; x20 LLE  Bridge with neutral lumbar spine; 2x10   Bird dog; 2x10 alternating R/L   Wall ball partial squat, Green swiss ball; 2x10  Seated swiss ball, resisted shoulder extension with Blue Tband; 2x10   PATIENT EDUCATION: Discussed potential referral back to MD if pain status is not changed, HEP compliance, expectations with PT.     *not today* Swiss's ball march, seated on Green swiss ball; 2x10 alternating R/L  Anterior pelvic tilt; 2x10 Lower trunk rotation; x10 alternating R/L Open book; 1x10 lying on R and L side Quadruped sidebend; x10 alternating R/L Repeated flexion in lying (double knee to chest); 1x10; Repeated flexion in lying (double knee to chest); 1x10, clinician overpressure;  SLR; 2x10 with each LE Piriformis stretch, figure-4 pull; 2 x 30 sec       PATIENT EDUCATION:  Education details: see above for patient education details Person educated: Patient Education method: Explanation, Demonstration, and Handouts Education comprehension: verbalized understanding and returned demonstration     HOME EXERCISE PROGRAM:  Access Code: [email or web address] URL: [email or web address] Date: 08/27/2022 Prepared by: Consuela Mimes  Exercises - Prone Press Up  - 5-6 x daily - 7 x weekly - 1 sets - 10 reps - 1sec hold - Supine Piriformis Stretch with Foot on Ground  - 2 x daily - 7 x weekly - 3 sets - 30sec hold - Lower Trunk Rotations  - 2 x daily - 7 x weekly - 2 sets - 10 reps - Active Straight Leg Raise with Quad Set  - 1 x daily - 7 x weekly - 2 sets - 10 reps - Prone Quadriceps Stretch with  Strap  - 2 x daily - 7 x weekly - 3 sets - 30sec hold      ASSESSMENT:   CLINICAL IMPRESSION: Patient unfortunately reports no notable change in NPRS and has consistently reported her condition being "about the same." We have worked on force progressions for repeated movement program  and attempted trials of traction, STM/IASTM, and dry needling. Patient tolerates PT well and participates well with active PT intervention in office; however, HEP compliance has been limited with pt not completing extension often during the day. She has no significant pain with frontal plane or transverse plane motion; however, she has remaining pain with flexion>extension and ongoing activity limitations with prolonged walking and prolonged sitting. We will likely refer back to MD if pt shows suboptimal progress with PT with completion of progress note next visit. Pt has remaining impairments in thoracolumbar AROM, quadriceps tightness, taut/tender gluteal mm and deep hip ER, postural changes, and L lumbar flank pain with LLE referral as far as L ankle. Patient will benefit from continued skilled therapeutic intervention  to address the above deficits as needed for improved function and QoL.       OBJECTIVE IMPAIRMENTS: Abnormal gait, difficulty walking, decreased ROM, decreased strength, hypomobility, impaired flexibility, postural dysfunction, and pain.    ACTIVITY LIMITATIONS: lifting, sitting, standing, transfers, and locomotion level   PARTICIPATION LIMITATIONS: shopping, community activity, and outdoor walking   PERSONAL FACTORS: Past/current experiences, Time since onset of injury/illness/exacerbation, and 3+ comorbidities: (Hx of MI, HLD, HTN, seizure disorder)  are also affecting patient's functional outcome.    REHAB POTENTIAL: Good   CLINICAL DECISION MAKING: Evolving/moderate complexity   EVALUATION COMPLEXITY: Moderate     GOALS:   SHORT TERM GOALS: Target date: 08/20/2022    Pt will be  independent with HEP in order to improve strength and decrease back pain to improve pain-free function at home and work. Baseline: 07/28/22: Baseline HEP initiated with focus on repeated flexion and piriformis stretching.    08/27/22: Partial compliance, limited HEP frequency for repeated movement program (1-2x/day).  Goal status: ON-GOING     LONG TERM GOALS: Target date: 09/10/2022   Pt will increase FOTO to at least 67 to demonstrate significant improvement in function at home and work related to back pain  Baseline: 07/28/22: 63.  08/27/22: 60/67 Goal status: NOT MET    2.  Pt will decrease worst back pain by at least 2 points on the NPRS in order to demonstrate clinically significant reduction in back pain. Baseline: 07/28/22: 10/10 at worst    08/27/22: 9/10 at worst over last week.  Goal status: NOT MET    3.  Patient will have full thoracolumbar AROM without reproduction of pain as needed for reaching items on ground, household chores, bending.    Baseline: 07/28/22: pain with R sidebend/rotation, pain and motion loss with flexion and extension.    08/27/22: Pain mainly with flexion/extension, mild flexion motion loss.  Goal status: IN PROGRESS    4.  Pt will resume outdoor walking as desired without reproduction of pain > 0-1/10 as needed for participation in her regular hobby Baseline: 07/28/22: Pt limited in outdoor walking routine.    08/27/22: Pain with walking to mailbox; okay walking next door to her father's house.  Goal status: ON-GOING      PLAN: PT FREQUENCY: 1-2x/week   PT DURATION: 3-4 weeks   PLANNED INTERVENTIONS: Therapeutic exercises, Therapeutic activity, Neuromuscular re-education, Balance training, Gait training, Patient/Family education, Self Care, Joint mobilization, Joint manipulation, Vestibular training, Canalith repositioning, Orthotic/Fit training, DME instructions, Dry Needling, Electrical stimulation, Spinal manipulation, Spinal mobilization, Cryotherapy, Moist  heat, Taping, Traction, Ultrasound, Ionotophoresis 4mg /ml Dexamethasone, Manual therapy, and Re-evaluation.   PLAN FOR NEXT SESSION:  Extension principle MDT with progression of force prn, use MT for symptom modulation as needed and progress with posterior chain strengthening and graded movement as tolerated.  Consider referral back to MD versus ongoing PT plan of care as pt approaches 10 visits.      Consuela Mimes, PT, DPT #[id number]  Gertie Exon, PT 09/01/2022, 9:02 AM

## 2022-09-03 ENCOUNTER — Ambulatory Visit: Payer: 59 | Admitting: Physical Therapy

## 2022-09-03 DIAGNOSIS — M6281 Muscle weakness (generalized): Secondary | ICD-10-CM

## 2022-09-03 DIAGNOSIS — M5442 Lumbago with sciatica, left side: Secondary | ICD-10-CM | POA: Diagnosis not present

## 2022-09-03 DIAGNOSIS — G8929 Other chronic pain: Secondary | ICD-10-CM

## 2022-09-03 DIAGNOSIS — R262 Difficulty in walking, not elsewhere classified: Secondary | ICD-10-CM

## 2022-09-03 NOTE — Therapy (Unsigned)
OUTPATIENT PHYSICAL THERAPY TREATMENT NOTE   Patient Name: Samantha Watson MRN: [id number] DOB:Apr 17, 1970, 52 y.o., female Today's Date: 09/03/2022   END OF SESSION:   PT End of Session - 09/03/22 0943     Visit Number 10    Date for PT Re-Evaluation 09/08/22    Authorization Type [employer] Medicare    Progress Note Due on Visit 10    PT Start Time 0906    PT Stop Time 0945    PT Time Calculation (min) 39 min    Activity Tolerance Patient tolerated treatment well    Behavior During Therapy [facility] for tasks assessed/performed              Past Medical History:  Diagnosis Date   Hyperlipidemia    Hypertension    MI (myocardial infarction) (HCC) 2016   Seizures (HCC)    Past Surgical History:  Procedure Laterality Date   CARDIAC CATHETERIZATION  2016   possible    LEFT HEART CATH AND CORONARY ANGIOGRAPHY Left 04/07/2017   Procedure: LEFT HEART CATH AND CORONARY ANGIOGRAPHY;  Surgeon: Marcina Millard, MD;  Location: [facility] INVASIVE CV LAB;  Service: Cardiovascular;  Laterality: Left;   There are no problems to display for this patient.   PCP: Etheleen Nicks, NP  REFERRING PROVIDER: Gerlene Burdock, NP   REFERRING DIAG:  [phone] (ICD-10-CM) - Lumbago with sciatica, left side  G89.29 (ICD-10-CM) - Other chronic pain  M54.16 (ICD-10-CM) - Radiculopathy, lumbar region  M48.061 (ICD-10-CM) - Spinal stenosis, lumbar region without neurogenic claudication    THERAPY DIAG:  Chronic left-sided low back pain with left-sided sciatica  Difficulty in walking, not elsewhere classified  Muscle weakness (generalized)  Rationale for Evaluation and Treatment Rehabilitation  PERTINENT HISTORY:  Pt is a 52 year old female with primary complaint of low back pain with left-sided pain/sciatica. Atraumatic onset. Pt reports getting up one day and noticing it hurting more without specific injury. Pt reports being unable to complete hobbies (e.g. planting flowers, walking exercise  regimen). Hx of MI in 2016. Pt reports good cardiology follow-ups since then. Pt reports some pain into LLE. Pt reports some AM stiffness. Pt reports pain down to distal shin/distal leg region. Patient reports that medicine recommended by MD helps "a little." Pt feels she is getting adequate sleep each day.    PAIN:    Pain Intensity: Present: 8/10, Best: 6/10, Worst: 10/10 Pain location: L flank and radiating down length of LLE  Pain Quality: burning  Radiating: Yes , down LLE  Numbness/Tingling: Yes Focal Weakness: Yes, sometimes it feels like she may fall Aggravating factors: walking, mild pain with standing, mild pain with sitting (pt alternates between sitting/standing frequently),  Relieving factors: Tylenol Arthritis, pillow behind back in sitting 24-hour pain behavior: variable, sometimes worse in day and sometimes at night  History of prior back injury, pain, surgery, or therapy: No   Imaging: Yes ,   EXAM: Magnetic resonance imaging, spinal canal and contents, lumbar, without contrast material.    Heterogeneous bone marrow signal abnormality likely a sequela of degenerative change. Small benign osseous hemangioma in the L3 vertebral body. The conus medullaris ends at a normal level.  The vertebral bodies are normally aligned. 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 Multilevel disc space narrowing and disc desiccation.  T12-L1: Mild concentric disc bulge with no significant spinal canal or neural foraminal narrowing.  L1-L2: No significant spinal canal or neural foraminal narrowing. There is mild bilateral facet  arthropathy.  L2-L3: Mild concentric disc bulge with no significant spinal canal narrowing. Mild bilateral neuroforaminal narrowing, greater on the left due to asymmetric facet arthropathy.  L3-L4: Moderate spinal canal narrowing due to a combination of concentric disc bulge with superimposed small right  subarticular disc protrusion resulting in narrowing of the right lateral recess and indenting/impinging the traversing right L4 nerve root. Moderate bilateral neuroforaminal narrowing due to prominent facet arthropathy and disc bulging extending into the foraminal zones.  L4-L5: Moderate spinal canal narrowing due to a combination of concentric disc bulge and bilateral facet arthropathy. Disc bulge slightly indents the bilateral traversing L5 nerve roots, greater on the right. There is posterior disc annular fissuring. Moderate to severe bilateral neuroforaminal narrowing, greater on the left.  L5-S1: Mild disc bulging with no significant spinal canal or neural foraminal narrowing. There is bilateral facet arthropathy.        Red flags: Negative for bowel/bladder changes, saddle paresthesia, personal history of cancer, h/o spinal tumors, h/o compression fx, h/o abdominal aneurysm, abdominal pain, chills/fever, night sweats, nausea, vomiting, unrelenting pain, first onset of insidious LBP <20 y/o     PRECAUTIONS: None   WEIGHT BEARING RESTRICTIONS: No   FALLS: Has patient fallen in last 6 months? No   Living Environment Lives with:  pt lives beside her father and her stepmom Lives in: House/apartment     Prior level of function: Independent   Occupational demands: -   Hobbies: Gardening, planting flowers, outdoor walking   Patient Goals: Improved pain, return to hobbies      PRECAUTIONS: PCN allergy   SUBJECTIVE:                                                                                                                                                                                      SUBJECTIVE STATEMENT:  Patient reports 7/10 pain affecting in back mostly and states it is in her leg. Patient has some swelling in her L knee; no significant activity contributing to swelling. Patient reports 1-2x/day performing repeated extension. Patient reports intermittent disturbed sleep  "every now and then." Patient reports PT is "helping some." Pt reports tolerating sitting well. She states it hurts more when she is active.    PAIN:  Are you having pain? 7/10 pain affecting L side of back and her L thigh     OBJECTIVE: (objective measures completed at initial evaluation unless otherwise dated)   Patient Surveys  FOTO 63, predicted outcome score of 67         GAIT: Distance walked: 60 ft Assistive device utilized: None Level of assistance: Complete Independence Comments: Antalgic pattern with dec stance time on  L side, decreased arm swing/trunk rotation   Posture: Guarded posture of thoracolumbar spine Lumbar lordosis: WNL Lumbar lateral shift: Negative   AROM      AROM (Normal range in degrees) AROM  AROM 08/27/22  Lumbar     Flexion (65) 75%* 75%* (L gluteal/iliolumbar)  Extension (30) 75%* 100%*   Right lateral flexion (25) 100% (pull on L flank) 100%  Left lateral flexion (25) 100% 100%  Right rotation (30) 100%* 100%  Left rotation (30) 100% 100%          Hip Right Left   Flexion (125)       Extension (15)       Abduction (40)       Adduction        Internal Rotation (45)       External Rotation (45)               (* = pain; Blank rows = not tested)     LE MMT: MMT (out of 5) Right   Left    Hip flexion 4-* 4-*  Hip extension      Hip abduction (seated) 4 4  Hip adduction 5 5  Hip internal rotation      Hip external rotation      Knee flexion      Knee extension 4+ 4  Ankle dorsiflexion 4+ 4+  Ankle plantarflexion      Ankle inversion      Ankle eversion      (* = pain; Blank rows = not tested)   Sensation Grossly intact to light touch throughout bilateral LEs as determined by testing dermatomes L2-S2. Proprioception, stereognosis, and hot/cold testing deferred on this date.   Reflexes R/L Knee Jerk (L3/4): 3+/3+  Ankle Jerk (S1/2): 3+/3+  [Pt is generally hyperreflexic]   Muscle Length Hamstrings: R: Negative L:  Negative Ely (quadriceps): R: Positive L: Positive     Palpation Location Right Left         Lumbar paraspinals 0 2  Quadratus Lumborum 0 0  Gluteus Maximus 0 2  Gluteus Medius 0 2  Deep hip external rotators 0 2  PSIS 1 1  Fortin's Area (SIJ)      Greater Trochanter      (Blank rows = not tested) Graded on 0-4 scale (0 = no pain, 1 = pain, 2 = pain with wincing/grimacing/flinching, 3 = pain with withdrawal, 4 = unwilling to allow palpation)   Passive Accessory Intervertebral Motion Reproduction of back pain with CPA L3-S1, empty end-feel   Special Tests Lumbar Radiculopathy and Discogenic: Centralization and Peripheralization (SN 92, -LR 0.12): Minimal c/o pain in lying following repeated flexion in lying (double knee to chest) Slump (SN 83, -LR 0.32): R: Negative L: Negative SLR (SN 92, -LR 0.29): R: Negative L:  Negative   Clonus: 2-3 beats RLE, 3-4 beats LLE Hoffman's Sign: R Negative L Negative Babinski: R Negative, L Negative    Lumbar spine compression: Positive for pain provocation Lumbar spine general distraction in hooklying: Negative for symptom relief    Facet Joint: Extension-Rotation (SN 100, -LR 0.0): R: Negative (pain on L with provocative position), L: Positive   Lumbar Foraminal Stenosis: Lumbar quadrant (SN 70): R: Negative L: Positive    Piriformis Syndrome: PACE Sign: Mild positive L       TODAY'S TREATMENT:     Manual Therapy - for symptom modulation, soft tissue sensitivity and mobility, joint mobility, ROM    *not today* LLE  long-leg distraction with pt in hooklying on table; 10 sec on, 5 sec off; x 3 minutes STM and IASTM with Hypervolt along bilat erector spine L3-5; x 8 minutes  -discomfort in L knee with full extension position     Therapeutic Exercise - for improved soft tissue flexibility and extensibility as needed for ROM, MDT/repeated movement to modulate pain and improve ROM    AROM Lumbar flexion 75% (L thigh pain)   Lumbar extension 75% (pain in back)  Lateral flexion: R 100%, L 100% Thoracolumbar rotation: R 100%, L 100%* (pain along L paraspinal)   Repeated extension in lying, with clinician overpessure; 1x10, 1 sec  -burning pain along L flank after, "not much in L thigh"  -"not much" L flank pain after second set -no significant pain in lying or walking after repeated movement  Repeated manual overpressure in lying, with clinician overpessure; 1x15, 1 sec  Repeated side glide to L; R arm on wall; 1x10, 1 sec    PATIENT EDUCATION: Discussed potential referral back to MD if pain status is not changed, HEP compliance, expectations with PT.     *not today* Prone Quadriceps stretch; 3 x 30 sec  Active hamstrings stretch/sciatic flossing; x20 LLE Bridge with neutral lumbar spine; 2x10  Bird dog; 2x10 alternating R/L  Wall ball partial squat, Green swiss ball; 2x10 Seated swiss ball, resisted shoulder extension with Blue Tband; 2x10 Repeated extension in lying, with patient overpessure; 1x10, 1 sec   -no change Swiss's ball march, seated on Green swiss ball; 2x10 alternating R/L  Anterior pelvic tilt; 2x10 Lower trunk rotation; x10 alternating R/L Open book; 1x10 lying on R and L side Quadruped sidebend; x10 alternating R/L Repeated flexion in lying (double knee to chest); 1x10; Repeated flexion in lying (double knee to chest); 1x10, clinician overpressure;  SLR; 2x10 with each LE Piriformis stretch, figure-4 pull; 2 x 30 sec       PATIENT EDUCATION:  Education details: see above for patient education details Person educated: Patient Education method: Explanation, Demonstration, and Handouts Education comprehension: verbalized understanding and returned demonstration     HOME EXERCISE PROGRAM:  Access Code: [email or web address] URL: [email or web address] Date: 08/27/2022 Prepared by: Consuela Mimes  Exercises - Prone Press Up  - 5-6 x daily - 7 x weekly - 1 sets - 10 reps  - 1sec hold - Supine Piriformis Stretch with Foot on Ground  - 2 x daily - 7 x weekly - 3 sets - 30sec hold - Lower Trunk Rotations  - 2 x daily - 7 x weekly - 2 sets - 10 reps - Active Straight Leg Raise with Quad Set  - 1 x daily - 7 x weekly - 2 sets - 10 reps - Prone Quadriceps Stretch with Strap  - 2 x daily - 7 x weekly - 3 sets - 30sec hold      ASSESSMENT:   CLINICAL IMPRESSION: Patient unfortunately reports no notable change in NPRS and has consistently reported her condition being "about the same." We have worked on force progressions for repeated movement program  and attempted trials of traction, STM/IASTM, and dry needling. Patient tolerates PT well and participates well with active PT intervention in office; however, HEP compliance has been limited with pt not completing extension often during the day. She has no significant pain with frontal plane or transverse plane motion; however, she has remaining pain with flexion>extension and ongoing activity limitations with prolonged walking and prolonged sitting. We will likely refer back to MD  if pt shows suboptimal progress with PT with completion of progress note next visit. Pt has remaining impairments in thoracolumbar AROM, quadriceps tightness, taut/tender gluteal mm and deep hip ER, postural changes, and L lumbar flank pain with LLE referral as far as L ankle. Patient will benefit from continued skilled therapeutic intervention to address the above deficits as needed for improved function and QoL.       OBJECTIVE IMPAIRMENTS: Abnormal gait, difficulty walking, decreased ROM, decreased strength, hypomobility, impaired flexibility, postural dysfunction, and pain.    ACTIVITY LIMITATIONS: lifting, sitting, standing, transfers, and locomotion level   PARTICIPATION LIMITATIONS: shopping, community activity, and outdoor walking   PERSONAL FACTORS: Past/current experiences, Time since onset of injury/illness/exacerbation, and 3+  comorbidities: (Hx of MI, HLD, HTN, seizure disorder)  are also affecting patient's functional outcome.    REHAB POTENTIAL: Good   CLINICAL DECISION MAKING: Evolving/moderate complexity   EVALUATION COMPLEXITY: Moderate     GOALS:   SHORT TERM GOALS: Target date: 08/20/2022    Pt will be independent with HEP in order to improve strength and decrease back pain to improve pain-free function at home and work. Baseline: 07/28/22: Baseline HEP initiated with focus on repeated flexion and piriformis stretching.    08/27/22: Partial compliance, limited HEP frequency for repeated movement program (1-2x/day).  Goal status: ON-GOING     LONG TERM GOALS: Target date: 09/10/2022   Pt will increase FOTO to at least 67 to demonstrate significant improvement in function at home and work related to back pain  Baseline: 07/28/22: 63.  08/27/22: 60/67.  09/03/22: 60/67 Goal status: NOT MET    2.  Pt will decrease worst back pain by at least 2 points on the NPRS in order to demonstrate clinically significant reduction in back pain. Baseline: 07/28/22: 10/10 at worst    08/27/22: 9/10 at worst over last week.  Goal status: NOT MET    3.  Patient will have full thoracolumbar AROM without reproduction of pain as needed for reaching items on ground, household chores, bending.    Baseline: 07/28/22: pain with R sidebend/rotation, pain and motion loss with flexion and extension.    08/27/22: Pain mainly with flexion/extension, mild flexion motion loss.  09/03/22: Pain with flexion, min pain with extension pain with L rotation.  Goal status: NOT MET    4.  Pt will resume outdoor walking as desired without reproduction of pain > 0-1/10 as needed for participation in her regular hobby Baseline: 07/28/22: Pt limited in outdoor walking routine.    08/27/22: Pain with walking to mailbox; okay walking next door to her father's house.  09/03/22: Ongoing pain when pt is more physically active/increase in activity; LLE pain with  walking trials in office.  Goal status: NOT MET      PLAN: PT FREQUENCY: 1-2x/week   PT DURATION: 3-4 weeks   PLANNED INTERVENTIONS: Therapeutic exercises, Therapeutic activity, Neuromuscular re-education, Balance training, Gait training, Patient/Family education, Self Care, Joint mobilization, Joint manipulation, Vestibular training, Canalith repositioning, Orthotic/Fit training, DME instructions, Dry Needling, Electrical stimulation, Spinal manipulation, Spinal mobilization, Cryotherapy, Moist heat, Taping, Traction, Ultrasound, Ionotophoresis 4mg /ml Dexamethasone, Manual therapy, and Re-evaluation.   PLAN FOR NEXT SESSION:  Extension principle MDT with progression of force prn, use MT for symptom modulation as needed and progress with posterior chain strengthening and graded movement as tolerated.  Consider referral back to MD versus ongoing PT plan of care as pt approaches 10 visits.      Consuela Mimes, PT, DPT [phone]  Riki Rusk  Doyce Loose, PT 09/03/2022, 9:49 AM

## 2022-09-07 ENCOUNTER — Encounter: Payer: Self-pay | Admitting: Physical Therapy

## 2023-03-10 IMAGING — US US EXTREM LOW VENOUS*L*
1 series · 13 of 24 positions shown · non-contrast
Comparison: None.

CLINICAL DATA: 50-year-old female with left lower extremity
swelling.

EXAM:
LEFT LOWER EXTREMITY VENOUS DOPPLER ULTRASOUND
TECHNIQUE: Gray-scale sonography with graded compression, as well as color
Doppler and duplex ultrasound were performed to evaluate the left
lower extremity deep venous systems from the level of the common
femoral vein and including the common femoral, femoral, profunda
femoral, popliteal and calf veins including the posterior tibial,
peroneal and gastrocnemius veins when visible. Spectral Doppler was
utilized to evaluate flow at rest and with distal augmentation
maneuvers in the common femoral, femoral and popliteal veins. The
contralateral common femoral vein was also evaluated for comparison.

[Series 1: us venous img lower uni left (dvt) · portal-venous · 13 of 33 slices shown]
[im 1/33]
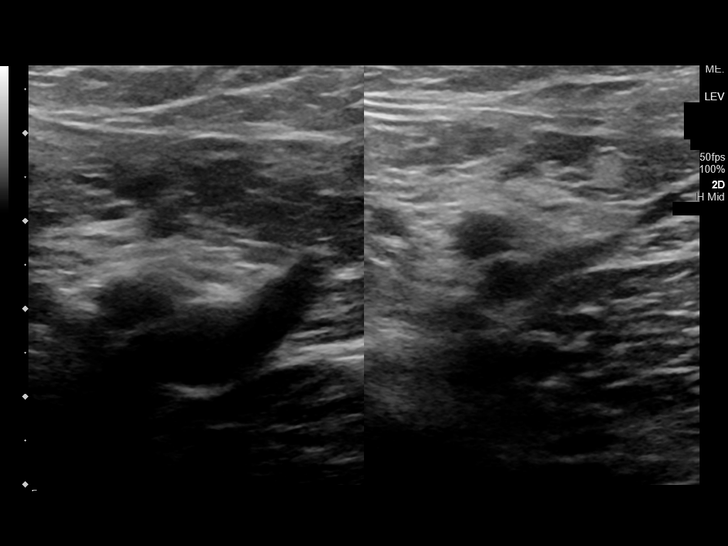
[im 3/33]
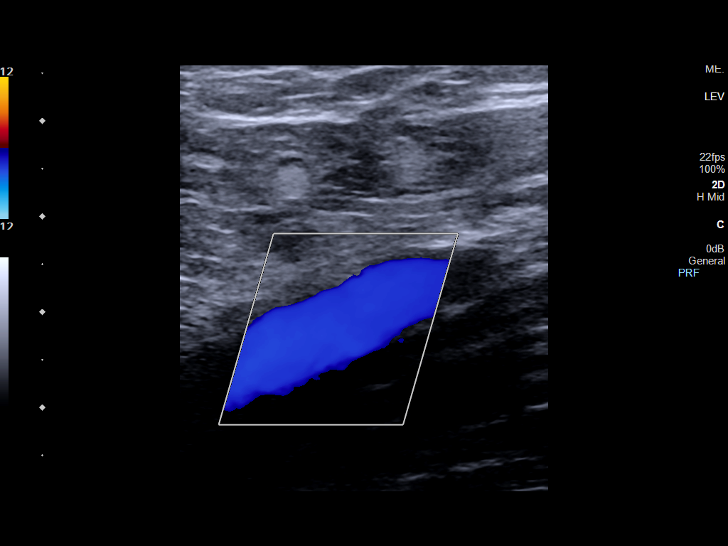
[im 6/33]
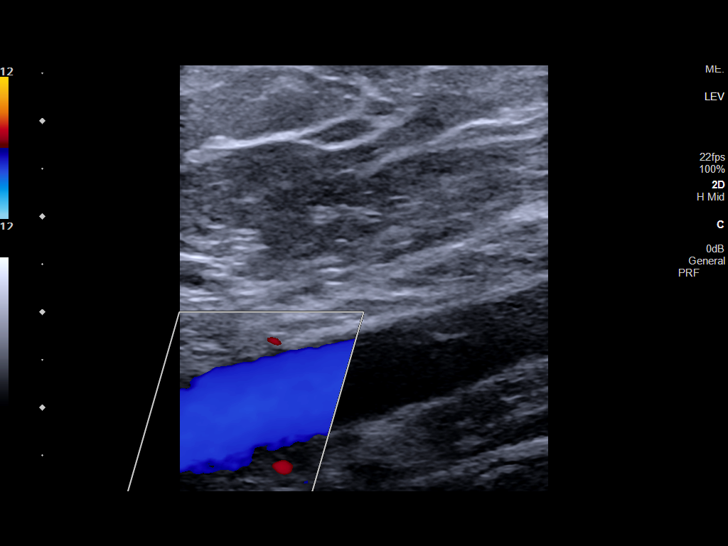
[im 9/33]
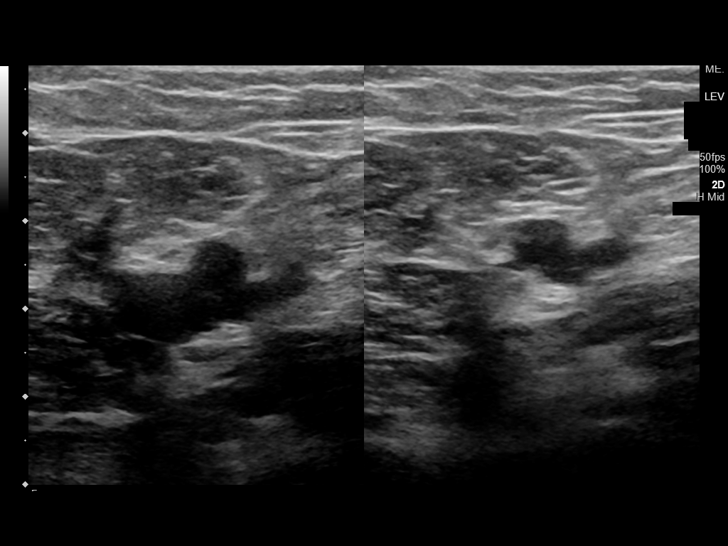
[im 12/33]
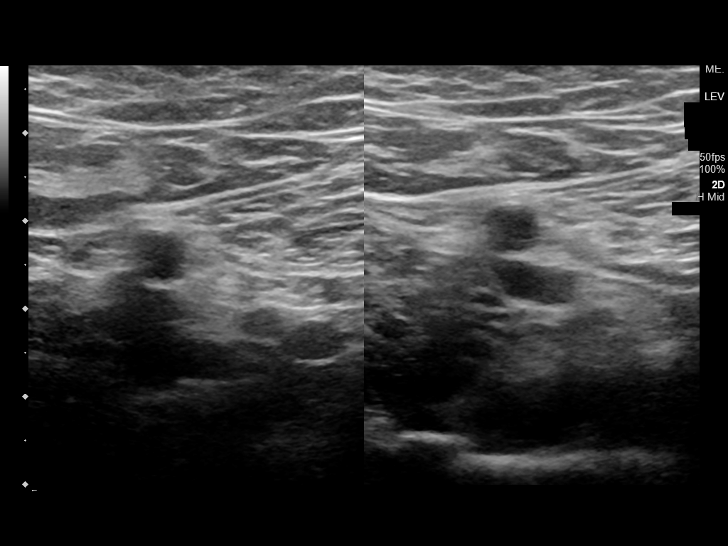
[im 14/33]
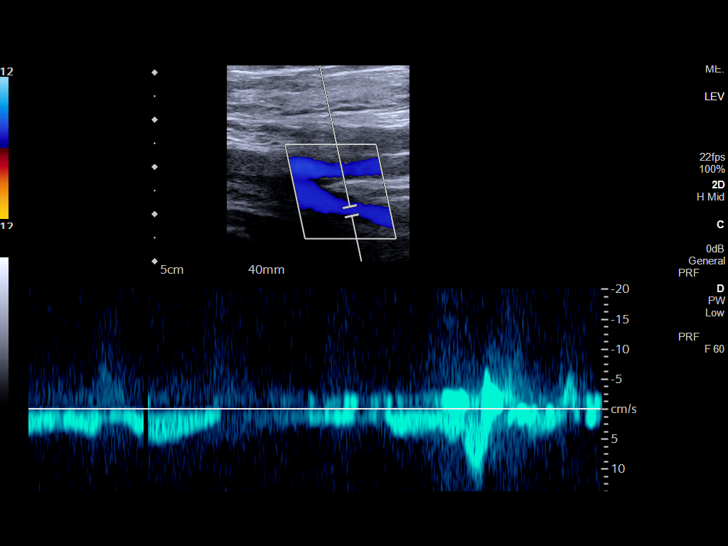
[im 17/33]
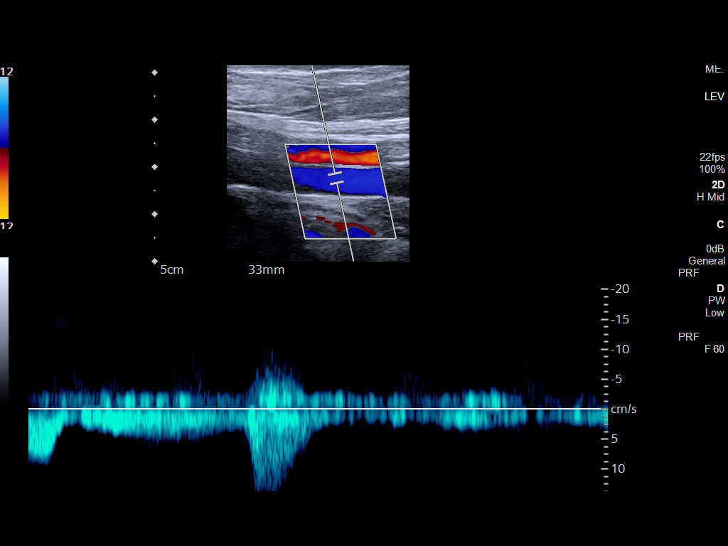
[im 19/33]
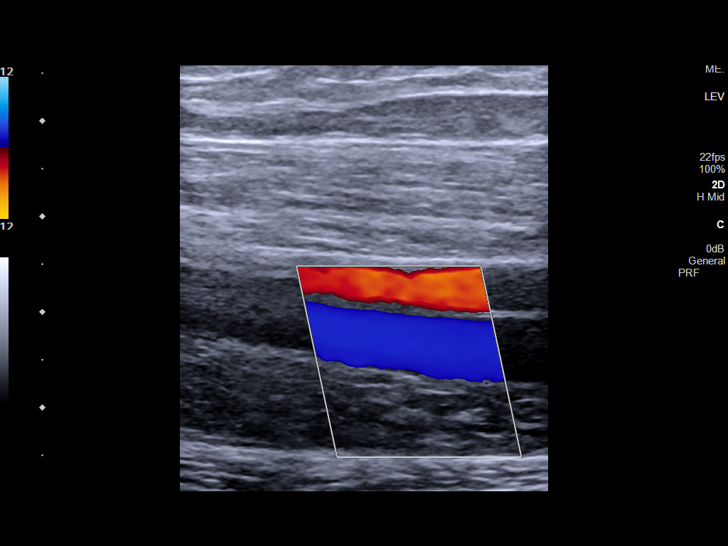
[im 21/33]
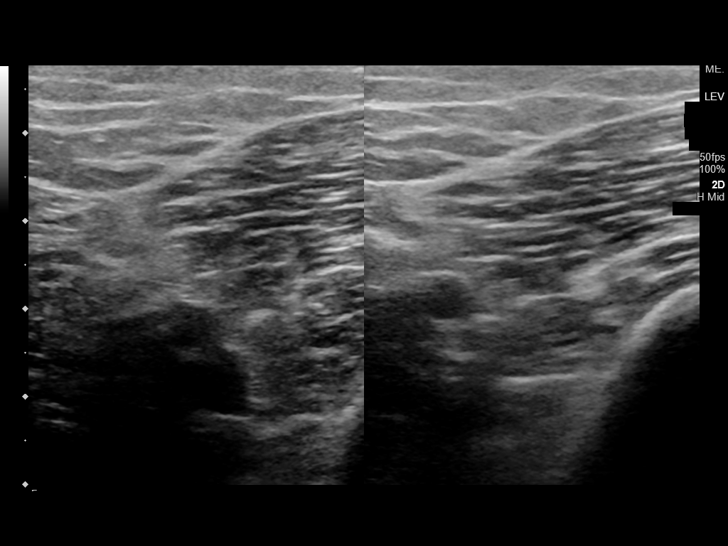
[im 24/33]
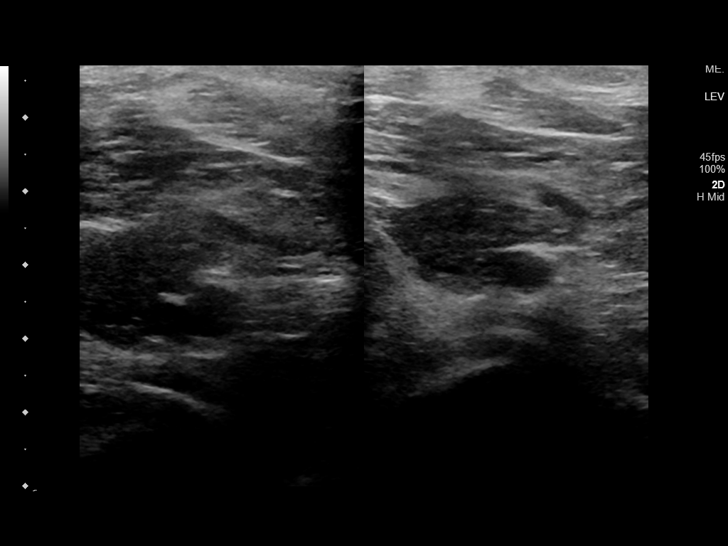
[im 27/33]
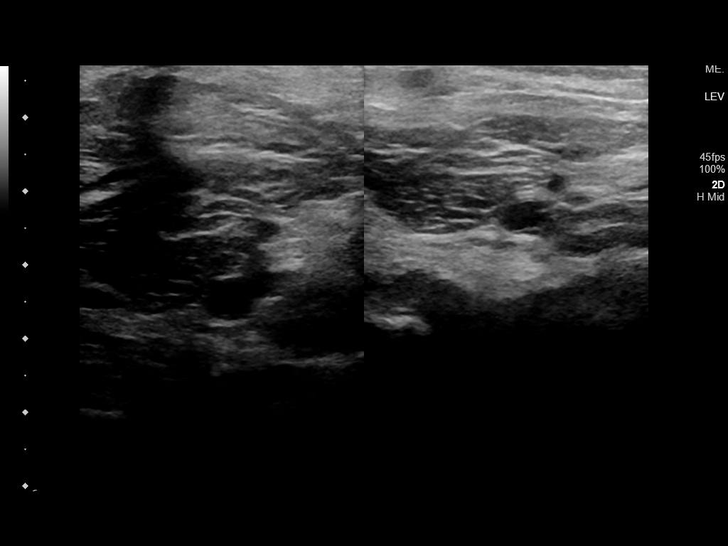
[im 30/33]
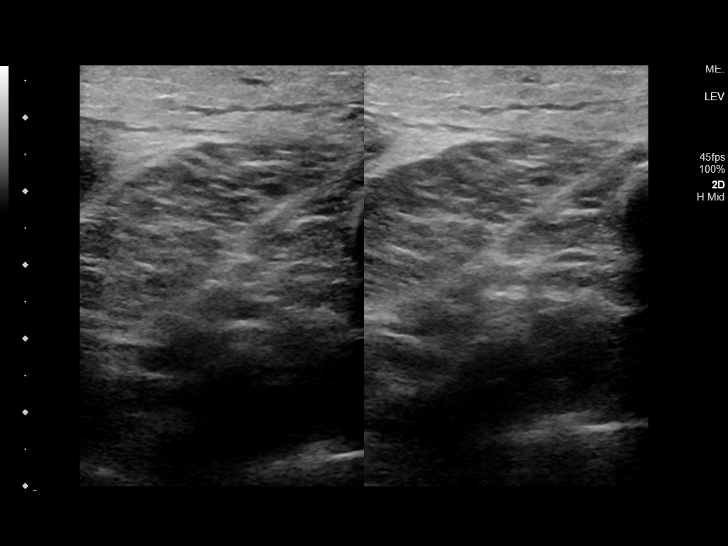
[im 33/33]
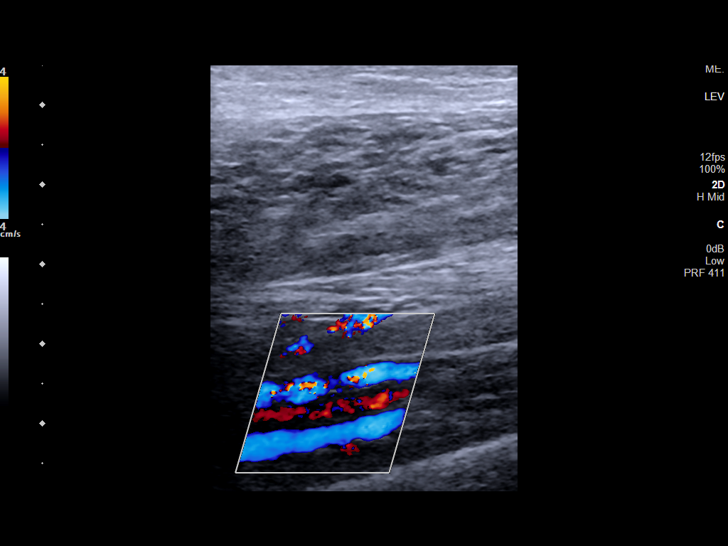

[13 of 24 positions shown; findings below may reference images not displayed]

FINDINGS: LEFT LOWER EXTREMITY

Common Femoral Vein: No evidence of thrombus. Normal
compressibility, respiratory phasicity and response to augmentation.

Central Greater Saphenous Vein: No evidence of thrombus. Normal
compressibility and flow on color Doppler imaging.

Central Profunda Femoral Vein: No evidence of thrombus. Normal
compressibility and flow on color Doppler imaging.

Femoral Vein: No evidence of thrombus. Normal compressibility,
respiratory phasicity and response to augmentation.

Popliteal Vein: No evidence of thrombus. Normal compressibility,
respiratory phasicity and response to augmentation.

Calf Veins: No evidence of thrombus. Normal compressibility and flow
on color Doppler imaging.

Other Findings:  None.

RIGHT LOWER EXTREMITY

Common Femoral Vein: No evidence of thrombus. Normal
compressibility, respiratory phasicity and response to augmentation.
IMPRESSION: No evidence of left lower extremity deep venous thrombosis.
